# Patient Record
Sex: Female | Born: 1980 | Race: White | Hispanic: No | Marital: Single | Smoking: Current every day smoker
Health system: Southern US, Community
[De-identification: ages and names within clinical notes are randomized; demographics above are authoritative.]

## PROBLEM LIST (undated history)

## (undated) DIAGNOSIS — F17200 Nicotine dependence, unspecified, uncomplicated: Secondary | ICD-10-CM

## (undated) DIAGNOSIS — O903 Peripartum cardiomyopathy: Secondary | ICD-10-CM

## (undated) DIAGNOSIS — K859 Acute pancreatitis without necrosis or infection, unspecified: Secondary | ICD-10-CM

## (undated) DIAGNOSIS — E876 Hypokalemia: Secondary | ICD-10-CM

## (undated) DIAGNOSIS — I5021 Acute systolic (congestive) heart failure: Secondary | ICD-10-CM

## (undated) DIAGNOSIS — K219 Gastro-esophageal reflux disease without esophagitis: Secondary | ICD-10-CM

## (undated) DIAGNOSIS — J45909 Unspecified asthma, uncomplicated: Secondary | ICD-10-CM

## (undated) DIAGNOSIS — I10 Essential (primary) hypertension: Secondary | ICD-10-CM

## (undated) DIAGNOSIS — F32A Depression, unspecified: Secondary | ICD-10-CM

## (undated) DIAGNOSIS — M1990 Unspecified osteoarthritis, unspecified site: Secondary | ICD-10-CM

## (undated) DIAGNOSIS — Z720 Tobacco use: Secondary | ICD-10-CM

## (undated) DIAGNOSIS — R569 Unspecified convulsions: Secondary | ICD-10-CM

## (undated) DIAGNOSIS — F1010 Alcohol abuse, uncomplicated: Secondary | ICD-10-CM

## (undated) DIAGNOSIS — F419 Anxiety disorder, unspecified: Secondary | ICD-10-CM

## (undated) HISTORY — DX: Gastro-esophageal reflux disease without esophagitis: K21.9

## (undated) HISTORY — DX: Unspecified osteoarthritis, unspecified site: M19.90

## (undated) HISTORY — DX: Depression, unspecified: F32.A

## (undated) HISTORY — DX: Nicotine dependence, unspecified, uncomplicated: F17.200

## (undated) HISTORY — DX: Tobacco use: Z72.0

## (undated) HISTORY — DX: Essential (primary) hypertension: I10

## (undated) HISTORY — DX: Anxiety disorder, unspecified: F41.9

## (undated) HISTORY — DX: Acute pancreatitis without necrosis or infection, unspecified: K85.90

## (undated) HISTORY — PX: DILATION AND CURETTAGE OF UTERUS: SHX78

## (undated) HISTORY — DX: Peripartum cardiomyopathy: O90.3

## (undated) HISTORY — PX: TUBAL LIGATION: SHX77

## (undated) HISTORY — DX: Hypokalemia: E87.6

## (undated) HISTORY — DX: Acute systolic (congestive) heart failure: I50.21

---

## 2008-09-29 ENCOUNTER — Encounter: Payer: Self-pay | Admitting: Cardiology

## 2008-09-29 ENCOUNTER — Ambulatory Visit: Payer: Self-pay | Admitting: Cardiology

## 2008-09-29 ENCOUNTER — Inpatient Hospital Stay (HOSPITAL_COMMUNITY): Admission: EM | Admit: 2008-09-29 | Discharge: 2008-10-03 | Payer: Self-pay | Admitting: Cardiology

## 2008-10-03 ENCOUNTER — Encounter (INDEPENDENT_AMBULATORY_CARE_PROVIDER_SITE_OTHER): Payer: Self-pay | Admitting: Physician Assistant

## 2008-10-08 ENCOUNTER — Encounter: Payer: Self-pay | Admitting: Cardiology

## 2008-10-24 ENCOUNTER — Ambulatory Visit: Payer: Self-pay | Admitting: Cardiology

## 2008-10-24 DIAGNOSIS — Z720 Tobacco use: Secondary | ICD-10-CM | POA: Insufficient documentation

## 2008-10-24 DIAGNOSIS — O903 Peripartum cardiomyopathy: Secondary | ICD-10-CM | POA: Insufficient documentation

## 2008-10-24 DIAGNOSIS — E876 Hypokalemia: Secondary | ICD-10-CM | POA: Insufficient documentation

## 2008-10-24 DIAGNOSIS — I509 Heart failure, unspecified: Secondary | ICD-10-CM | POA: Insufficient documentation

## 2008-10-24 DIAGNOSIS — F17200 Nicotine dependence, unspecified, uncomplicated: Secondary | ICD-10-CM

## 2008-11-14 ENCOUNTER — Encounter: Payer: Self-pay | Admitting: Cardiology

## 2008-11-16 ENCOUNTER — Telehealth (INDEPENDENT_AMBULATORY_CARE_PROVIDER_SITE_OTHER): Payer: Self-pay | Admitting: *Deleted

## 2008-11-20 ENCOUNTER — Encounter: Payer: Self-pay | Admitting: Cardiology

## 2008-11-21 ENCOUNTER — Telehealth (INDEPENDENT_AMBULATORY_CARE_PROVIDER_SITE_OTHER): Payer: Self-pay | Admitting: *Deleted

## 2008-11-22 ENCOUNTER — Telehealth (INDEPENDENT_AMBULATORY_CARE_PROVIDER_SITE_OTHER): Payer: Self-pay | Admitting: *Deleted

## 2008-11-27 ENCOUNTER — Ambulatory Visit: Payer: Self-pay | Admitting: Cardiology

## 2008-11-27 DIAGNOSIS — E663 Overweight: Secondary | ICD-10-CM | POA: Insufficient documentation

## 2008-11-27 DIAGNOSIS — L659 Nonscarring hair loss, unspecified: Secondary | ICD-10-CM | POA: Insufficient documentation

## 2008-12-12 ENCOUNTER — Encounter: Payer: Self-pay | Admitting: Cardiology

## 2009-01-31 ENCOUNTER — Ambulatory Visit: Payer: Self-pay | Admitting: Cardiology

## 2009-03-28 ENCOUNTER — Ambulatory Visit: Payer: Self-pay | Admitting: Cardiology

## 2009-04-04 ENCOUNTER — Telehealth: Payer: Self-pay | Admitting: Cardiology

## 2009-05-01 ENCOUNTER — Ambulatory Visit: Payer: Self-pay | Admitting: Cardiology

## 2009-05-15 ENCOUNTER — Encounter: Payer: Self-pay | Admitting: Cardiology

## 2009-07-02 ENCOUNTER — Ambulatory Visit: Payer: Self-pay | Admitting: Cardiology

## 2009-07-06 ENCOUNTER — Encounter (INDEPENDENT_AMBULATORY_CARE_PROVIDER_SITE_OTHER): Payer: Self-pay | Admitting: *Deleted

## 2009-07-11 ENCOUNTER — Ambulatory Visit: Payer: Self-pay | Admitting: Cardiology

## 2009-07-11 ENCOUNTER — Encounter: Payer: Self-pay | Admitting: Cardiology

## 2010-02-12 NOTE — Assessment & Plan Note (Signed)
Summary: f/u LA   Visit Type:  Follow-up Primary Provider:  Health Dept/Dr. Ernestina Penna  CC:  Cardiomyopathy.  History of Present Illness: The patient presents for evaluation of cardiomyopathy. Since I last saw her she was in the emergency room with chest pain. She was scared by a snake. She was treated with anxiolytics and has had no symptoms since that time. She has lost weight. She is denying any new shortness of breath, PND or orthopnea. She is having no palpitations, presyncope or syncope. She is complaining of hair loss.  Preventive Screening-Counseling & Management  Alcohol-Tobacco     Smoking Status: quit     Year Quit: 2010  Current Medications (verified): 1)  Digoxin 0.125 Mg Tabs (Digoxin) .... Take 1 Tablet By Mouth Once A Day 2)  Lisinopril 10 Mg Tabs (Lisinopril) .... Take 1 Tablet By Mouth Two Times A Day 3)  Furosemide 20 Mg Tabs (Furosemide) .... Take 1 Tablet By Mouth Once A Day 4)  Carvedilol 25 Mg Tabs (Carvedilol) .... Take 1 Tablet By Mouth Two Times A Day 5)  Potassium Chloride Crys Cr 20 Meq Cr-Tabs (Potassium Chloride Crys Cr) .... Two Tabs Once Daily 6)  Lorazepam 0.5 Mg Tabs (Lorazepam) .... Take 1 Tablet Every 8 Hours. 7)  Lexapro 10 Mg Tabs (Escitalopram Oxalate) .... Take 1 Tablet By Mouth Once A Day 8)  Oxycodone-Acetaminophen 5-325 Mg Tabs (Oxycodone-Acetaminophen) .... As Needed Back Pain  Allergies (verified): No Known Drug Allergies  Comments:  Nurse/Medical Assistant: The patient is currently on medications but does not know the name or dosage at this time. Instructed to contact our office with details. Will update medication list at that time.  Past History:  Past Medical History: Reviewed history from 05/01/2009 and no changes required. 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 Tobacco use.     Past Surgical History: Reviewed history from 11/27/2008 and no changes  required. Tubal ligation D&C  Review of Systems       As stated in the HPI and negative for all other systems.   Vital Signs:  Patient profile:   30 year old female Height:      68 inches Weight:      187 pounds Pulse rate:   85 / minute BP sitting:   113 / 77  (left arm) Cuff size:   large  Vitals Entered By: Carlye Grippe (July 02, 2009 8:48 AM)  Physical Exam  General:  Well developed, well nourished, in no acute distress. Head:  normocephalic and atraumatic Neck:  Neck supple, no JVD. No masses, thyromegaly or abnormal cervical nodes. Chest Wall:  no deformities or breast masses noted Lungs:  Clear bilaterally to auscultation and percussion. Heart:  Non-displaced PMI, chest non-tender; regular rate and rhythm, S1, S2 without murmurs, rubs or gallops. Carotid upstroke normal, no bruit. Normal abdominal aortic size, no bruits. Femorals normal pulses, no bruits. Pedals normal pulses. No edema, no varicosities. Abdomen:  Bowel sounds positive; abdomen soft and non-tender without masses, organomegaly, or hernias noted. No hepatosplenomegaly. Msk:  Back normal, normal gait. Muscle strength and tone normal. Extremities:  No clubbing or cyanosis. Neurologic:  Alert and oriented x 3. Psych:  Normal affect.   Impression & Recommendations:  Problem # 1:  CHF (ICD-428.0) Today I will not titrate her meds further. Her blood pressure would not allow. I will repeat an echocardiogram. We again discussed salt and fluid restriction and the need to continue this  medications.  Problem # 2:  ALOPECIA (ZOX-096.04) This certainly could be related to beta blockers. However, after reviewing the importance of these drugs she chooses to continue. I suggested she also consult with her GYN to see if he thinks it could be an alternative reason. We also discussed care of her hair and I asked her to limit the trauma to her hair and discuss this with her hairdresser.  Other Orders: 2-D Echocardiogram  (2D Echo)  Patient Instructions: 1)  Your physician has requested that you have an echocardiogram.  Echocardiography is a painless test that uses sound waves to create images of your heart. It provides your doctor with information about the size and shape of your heart and how well your heart's chambers and valves are working.  This procedure takes approximately one hour. There are no restrictions for this procedure. 2)  Your physician recommends that you continue on your current medications as directed. Please refer to the Current Medication list given to you today. 3)  Your physician wants you to follow-up in: 4 months. You will receive a reminder letter in the mail one-two months in advance. If you don't receive a letter, please call our office to schedule the follow-up appointment.

## 2010-02-12 NOTE — Assessment & Plan Note (Signed)
Summary: f/u LA   Visit Type:  Follow-up Primary Provider:  Health Dept/Dr. Ernestina Penna  CC:  CHF.  History of Present Illness: The patient presents for followup of her cardiomyopathy. Since I last saw her she has done relatively well. She tolerated an increased dose of carvedilol to 12.5 mg b.i.d. She has had no new presyncope or syncope with this. Should get dyspneic occasionally but can walk 15 minutes to the store and 15 minutes back without significant dyspnea. She doesn't describe PND or orthopnea. She only takes Lasix p.r.n. and doesn't need this frequently. She denies any chest pressure, neck or arm discomfort.  Preventive Screening-Counseling & Management  Alcohol-Tobacco     Smoking Status: quit > 6 months     Year Started: 1998     Year Quit: 2010  Current Medications (verified): 1)  Digoxin 0.125 Mg Tabs (Digoxin) .... Take 1 Tablet By Mouth Once A Day 2)  Lisinopril 10 Mg Tabs (Lisinopril) .... Take 1 Tablet By Mouth Two Times A Day 3)  Furosemide 20 Mg Tabs (Furosemide) .... Take 1 Tablet By Mouth Once A Day 4)  Carvedilol 12.5 Mg Tabs (Carvedilol) .... Take 1&1/2 Tablet By Mouth Two Times A Day 5)  Potassium Chloride Crys Cr 20 Meq Cr-Tabs (Potassium Chloride Crys Cr) .... Two Tabs Once Daily 6)  Lorazepam 0.5 Mg Tabs (Lorazepam) .... Take 1 Tablet Every 8 Hours. 7)  Lexapro 10 Mg Tabs (Escitalopram Oxalate) .... Take 1 Tablet By Mouth Once A Day 8)  Oxycodone-Acetaminophen 5-325 Mg Tabs (Oxycodone-Acetaminophen) .... As Needed Back Pain  Allergies (verified): No Known Drug Allergies  Comments:  Nurse/Medical Assistant: The patient's medications were reviewed with the patient and were updated in the Medication List. Pt verbally confirmed medications.  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 Tobacco use.      Review of Systems       As stated in the HPI and negative for all other systems.   Vital Signs:  Patient profile:   30 year old female Height:      68 inches Weight:      192.50 pounds BMI:     29.38 Pulse rate:   73 / minute BP sitting:   123 / 88  (left arm) Cuff size:   regular  Vitals Entered By: Cyril Loosen, RN, BSN (March 28, 2009 2:04 PM)  Nutrition Counseling: Patient's BMI is greater than 25 and therefore counseled on weight management options. CC: CHF Comments No cardiac complaints, except fatigue with extertion like cleaning.   Physical Exam  General:  Well developed, well nourished, in no acute distress. Head:  normocephalic and atraumatic Neck:  Neck supple, no JVD. No masses, thyromegaly or abnormal cervical nodes. Lungs:  Clear bilaterally to auscultation and percussion. Abdomen:  Bowel sounds positive; abdomen soft and non-tender without masses, organomegaly, or hernias noted. No hepatosplenomegaly,  Msk:  Back normal, normal gait. Muscle strength and tone normal. Extremities:  No clubbing or cyanosis. Neurologic:  Alert and oriented x 3. Skin:  Intact without lesions or rashes. Psych:  Normal affect.   Detailed Cardiovascular Exam  Neck    Carotids: Carotids full and equal bilaterally without bruits.      Neck Veins: Normal, no JVD.    Heart    Inspection: no deformities or lifts noted.      Palpation: normal PMI  with no thrills palpable.      Auscultation: regular rate and rhythm, S1, S2 without murmurs, rubs, gallops, or clicks.    Vascular    Abdominal Aorta: no palpable masses, pulsations, or audible bruits.      Femoral Pulses: normal femoral pulses bilaterally.      Pedal Pulses: normal pedal pulses bilaterally.      Radial Pulses: normal radial pulses bilaterally.      Peripheral Circulation: no clubbing, cyanosis, or edema noted with normal capillary refill.     Impression & Recommendations:  Problem # 1:  PERIPARTUM  CARDIOMYOPATHY ANTPRTM COND/COMP (BJY-782.95) Today I will titrate her carvedilol to 18.75 mg b.i.d. The plan will be 25 b.i.d. She is at target 86. In the future I will reevaluate her with followup echocardiography.  Problem # 2:  OVERWEIGHT (ICD-278.02) I gave her some simple diet and and exercise instructions.  Problem # 3:  TOBACCO ABUSE (ICD-305.1) She reports that she is no longer smoking.  Patient Instructions: 1)  Your physician has recommended you make the following change in your medication:INCREASE CARVEDILOL 12.5MG  TO 1&1/2 TWICE DAILY. Your new medication dose has been sent to your pharmacy. 2)  Your physician recommends that you schedule a follow-up appointment in: 05/01/09 @10 :45am with Dr.Alyzah Pelly.  Prescriptions: CARVEDILOL 12.5 MG TABS (CARVEDILOL) Take 1&1/2 tablet by mouth two times a day  #90 x 6   Entered by:   Carlye Grippe   Authorized by:   Rollene Rotunda, MD, [facility]   Signed by:   Carlye Grippe on 03/28/2009   Method used:   Electronically to        [facility] [address] (retail)       [address]       [place], [place]  [address]       Ph: [phone] or [phone]       [place]: [phone]   RxID:   [id number]

## 2010-02-12 NOTE — Assessment & Plan Note (Signed)
Summary: 6 WK FU PER DR. Siyana Erney @ CHECK OUT   Visit Type:  Follow-up Primary Provider:  Health Dept/Dr. Ernestina Penna  CC:  Peripartum Cardiomyopathy.  History of Present Illness: The patient presents for followup of this. She insisted she had an echocardiogram in the hospital in December. She says this would have been done at [facility]. However, we have searched everywhere and called her primary physician and find no evidence of this. Since I last saw her she has had no acute cardiac complaints. She denies any new shortness of breath, PND or orthopnea. She does occasionally get short of breath with activities such as carrying a laundry or her children. She has slowly gained weight but has had no swelling. She has had no chest pressure, neck or arm discomfort. She has had no palpitations, presyncope or syncope. She takes her Lasix only if she has swelling.  Preventive Screening-Counseling & Management  Alcohol-Tobacco     Smoking Status: quit > 6 months  Comments: Pt quit about 1 year ago after smoking for about 13 yrs.  Current Medications (verified): 1)  Digoxin 0.125 Mg Tabs (Digoxin) .... Take 1 Tablet By Mouth Once A Day 2)  Lisinopril 10 Mg Tabs (Lisinopril) .... Take 1 Tablet By Mouth Two Times A Day 3)  Furosemide 20 Mg Tabs (Furosemide) .... Take 1 Tablet By Mouth Once A Day 4)  Carvedilol 12.5 Mg Tabs (Carvedilol) .... Take 1 Tablet By Mouth Two Times A Day 5)  Potassium Chloride Crys Cr 20 Meq Cr-Tabs (Potassium Chloride Crys Cr) .... Two Tabs Once Daily 6)  Lorazepam 0.5 Mg Tabs (Lorazepam) .... Take 1 Tablet Every 8 Hours. 7)  Lexapro 10 Mg Tabs (Escitalopram Oxalate) .... Take 1 Tablet By Mouth Once A Day 8)  Oxycodone-Acetaminophen 5-325 Mg Tabs (Oxycodone-Acetaminophen) .... As Needed Back Pain  Allergies (verified): No Known Drug Allergies  Comments:  Nurse/Medical Assistant: The patient's medications were reviewed with the patient and were updated in the Medication  List. Pt brought a list of medications to office visit today. 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 History of tobacco use.     Review of Systems       Fatigue.  Otherwise has had other systems.  Vital Signs:  Patient profile:   30 year old female Height:      68 inches Weight:      185.50 pounds Pulse rate:   80 / minute BP sitting:   101 / 68  (left arm) Cuff size:   regular  Vitals Entered By: Cyril Loosen, RN, BSN (January 31, 2009 3:01 PM) CC: Peripartum Cardiomyopathy   Physical Exam  General:  Well developed, well nourished, in no acute distress. Head:  normocephalic and atraumatic Eyes:  PERRLA/EOM intact; conjunctiva and lids normal. Mouth:  Teeth, gums and palate normal. Oral mucosa normal. Neck:  Neck supple, no JVD. No masses, thyromegaly or abnormal cervical nodes. Chest Wall:  no deformities or breast masses noted Lungs:  Clear bilaterally to auscultation and percussion. Abdomen:  Bowel sounds positive; abdomen soft and non-tender without masses, organomegaly, or hernias noted. No hepatosplenomegaly,  Msk:  Back normal, normal gait. Muscle strength and tone normal. Extremities:  No clubbing or cyanosis. Neurologic:  Alert and oriented x 3. Skin:  Intact without lesions or rashes. Cervical Nodes:  no significant adenopathy Axillary Nodes:  no significant adenopathy  Inguinal Nodes:  no significant adenopathy Psych:  Normal affect.   Detailed Cardiovascular Exam  Neck    Carotids: Carotids full and equal bilaterally without bruits.      Neck Veins: Normal, no JVD.    Heart    Inspection: no deformities or lifts noted.      Palpation: normal PMI with no thrills palpable.      Auscultation: regular rate and rhythm, S1, S2 without murmurs, rubs, gallops, or clicks.    Vascular    Abdominal Aorta: no  palpable masses, pulsations, or audible bruits.      Femoral Pulses: normal femoral pulses bilaterally.      Pedal Pulses: normal pedal pulses bilaterally.      Radial Pulses: normal radial pulses bilaterally.      Peripheral Circulation: no clubbing, cyanosis, or edema noted with normal capillary refill.     Impression & Recommendations:  Problem # 1:  PERIPARTUM CARDIOMYOPATHY ANTPRTM COND/COMP (WJX-914.78) The patient has no symptoms I will try to titrate up to 12.5 mg is good she does complain of fatigue and does have a relatively low blood pressure but I think she will tolerate this she'll let me know if she has any increasing fatigue with episodes of lightheadedness or other.  I will see herher back in several weeks.  I will plan an echo in several weeks.  Problem # 2:  TOBACCO ABUSE (ICD-305.1) She reports that she is not smoking cigarettes.  Problem # 3:  OVERWEIGHT (ICD-278.02) She understands the importance of weight loss with diet and exercise.  Patient Instructions: 1)  Your physician recommends that you continue on your current medications as directed. Please refer to the Current Medication list given to you today.INCREASE CARVEDILOL TO 12.5MG  TWICE DAILY. 2)  Your physician recommends that you schedule a follow-up appointment in: 03/28/09 @2 :00pm with Dr. Antoine Poche. Prescriptions: CARVEDILOL 12.5 MG TABS (CARVEDILOL) Take 1 tablet by mouth two times a day  #60 x 6   Entered by:   Carlye Grippe   Authorized by:   Rollene Rotunda, MD, [facility]   Signed by:   Carlye Grippe on 01/31/2009   Method used:   Electronically to        [facility] [address] (retail)       [address]       [place], [place]  [address]       Ph: [phone] or [phone]       [place]: [phone]   RxID:   [id number]

## 2010-02-12 NOTE — Assessment & Plan Note (Signed)
Summary: 99month f/u LA   Visit Type:  Follow-up Primary Provider:  Health Dept/Dr. Ernestina Penna  CC:  Cardiomyopathy.  History of Present Illness: The patient presents for followup of her nonischemic cardiomyopathy. At the last visit I increased her carvedilol to 18.75 mg b.i.d. She did well with this. She has had no new shortness of breath. She denies any PND or orthopnea. She has been slightly lightheaded and does get some orthostatic symptoms. She's had no new palpitations or chest pain. She is walking for exercise but still gets fatigued.  Preventive Screening-Counseling & Management  Alcohol-Tobacco     Smoking Status: quit     Year Started: 1998     Year Quit: 2010  Current Medications (verified): 1)  Digoxin 0.125 Mg Tabs (Digoxin) .... Take 1 Tablet By Mouth Once A Day 2)  Lisinopril 10 Mg Tabs (Lisinopril) .... Take 1 Tablet By Mouth Two Times A Day 3)  Furosemide 20 Mg Tabs (Furosemide) .... Take 1 Tablet By Mouth Once A Day 4)  Carvedilol 25 Mg Tabs (Carvedilol) .... Take 1 Tablet By Mouth Two Times A Day 5)  Potassium Chloride Crys Cr 20 Meq Cr-Tabs (Potassium Chloride Crys Cr) .... Two Tabs Once Daily 6)  Lorazepam 0.5 Mg Tabs (Lorazepam) .... Take 1 Tablet Every 8 Hours. 7)  Lexapro 10 Mg Tabs (Escitalopram Oxalate) .... Take 1 Tablet By Mouth Once A Day 8)  Oxycodone-Acetaminophen 5-325 Mg Tabs (Oxycodone-Acetaminophen) .... As Needed Back Pain  Allergies (verified): No Known Drug Allergies  Comments:  Nurse/Medical Assistant: The patient's medications were reviewed with the patient and were updated in the Medication List. Pt brought a list of medications to office visit.  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 Tobacco use.     Past Surgical History: Reviewed history from 11/27/2008 and no changes  required. Tubal ligation D&C  Social History: Smoking Status:  quit  Review of Systems       As stated in the HPI and negative for all other systems.   Vital Signs:  Patient profile:   30 year old female Height:      68 inches Weight:      191.25 pounds Pulse rate:   83 / minute BP sitting:   110 / 76  (left arm) Cuff size:   regular  Vitals Entered By: Cyril Loosen, RN, BSN (May 01, 2009 10:44 AM) CC: Cardiomyopathy Comments Follow up visit.   Physical Exam  General:  Well developed, well nourished, in no acute distress. Head:  normocephalic and atraumatic Eyes:  PERRLA/EOM intact; conjunctiva and lids normal. Mouth:  Teeth, gums and palate normal. Oral mucosa normal. Neck:  Neck supple, no JVD. No masses, thyromegaly or abnormal cervical nodes. Lungs:  Clear bilaterally to auscultation and percussion. Abdomen:  Bowel sounds positive; abdomen soft and non-tender without masses, organomegaly, or hernias noted. No hepatosplenomegaly,  Msk:  Back normal, normal gait. Muscle strength and tone normal. Extremities:  No clubbing or cyanosis. Neurologic:  Alert and oriented x 3. Skin:  Intact without lesions or rashes. Psych:  Normal affect.   Detailed Cardiovascular Exam  Neck    Carotids: Carotids full and equal bilaterally without bruits.      Neck Veins: Normal, no JVD.    Heart    Inspection: no deformities or lifts noted.      Palpation: normal PMI with  no thrills palpable.      Auscultation: regular rate and rhythm, S1, S2 without murmurs, rubs, gallops, or clicks.    Vascular    Abdominal Aorta: no palpable masses, pulsations, or audible bruits.      Femoral Pulses: normal femoral pulses bilaterally.      Pedal Pulses: normal pedal pulses bilaterally.      Radial Pulses: normal radial pulses bilaterally.      Peripheral Circulation: no clubbing, cyanosis, or edema noted with normal capillary refill.     Impression & Recommendations:  Problem # 1:   CHF (ICD-428.0) Today I will try to increase her carvedilol to 25 mg twice a day. I would like for the lisinopril to eventually be 20 though her blood pressure may not allow.  Problem # 2:  OVERWEIGHT (ICD-278.02) We discussed the need to lose weight with diet and exercise.  Patient Instructions: 1)  Your physician recommends that you schedule a follow-up appointment in: JUNE 20TH 2011 @9 :00AM WITH DR. Levi Crass. 2)  Your physician has recommended you make the following change in your medication: INCREASE YOUR CARVEDILOL 25MG  TWICE DAILY. You may take 2 of your 12.5mg  twice daily until they are finished then get your new prescription that has been sent to your pharmacy.  Prescriptions: CARVEDILOL 25 MG TABS (CARVEDILOL) Take 1 tablet by mouth two times a day  #60 x 3   Entered by:   Carlye Grippe   Authorized by:   Rollene Rotunda, MD, [facility]   Signed by:   Carlye Grippe on 05/01/2009   Method used:   Electronically to        [facility] [address] (retail)       [address]       [place], [place]  [address]       Ph: [phone] or [phone]       [place]: [phone]   RxID:   [id number]

## 2010-02-12 NOTE — Progress Notes (Signed)
Summary: REQUEST LETTER R/E WORK STATUS  Phone Note Call from Patient Call back at [phone]   Caller: Patient Call For: nurse Summary of Call: message left on machine to please call. nurse called patient back and she said that she  needed a statement from MD for social services saying that she has heart problems and that she's unable to work at this time so that she can receive income for her children. Nurse informed patient request would be sent to MD.  Initial call taken by: Carlye Grippe,  April 04, 2009 11:20 AM

## 2010-02-12 NOTE — Letter (Signed)
Summary: Generic Engineer, agricultural at [facility] [address] [address]   [place], [place] [address]   Phone: [phone]  [place]: [phone]    07/06/2009  JENEVA SCHWEIZER [address] [place], [place]  [address]  Dear Ms. SEDANO,   Zigmund Gottron is a copy of your order for the 2 D Echo ordered by Dr. Antoine Poche. If for some reason you can not keep this appointment please call our office and we will be happy to re-schedule.        Sincerely,   Zachary George Patient Care Coordinator [phone] ext [phone]

## 2010-04-19 LAB — URINALYSIS, ROUTINE W REFLEX MICROSCOPIC
Leukocytes, UA: NEGATIVE
Nitrite: NEGATIVE
Specific Gravity, Urine: 1.015 (ref 1.005–1.030)
Urobilinogen, UA: 1 mg/dL (ref 0.0–1.0)
pH: 7.5 (ref 5.0–8.0)

## 2010-04-19 LAB — CBC
Hemoglobin: 9.6 g/dL — ABNORMAL LOW (ref 12.0–15.0)
MCHC: 33.8 g/dL (ref 30.0–36.0)
MCV: 88.7 fL (ref 78.0–100.0)
RBC: 3.2 MIL/uL — ABNORMAL LOW (ref 3.87–5.11)
RBC: 3.7 MIL/uL — ABNORMAL LOW (ref 3.87–5.11)
WBC: 7.1 10*3/uL (ref 4.0–10.5)
WBC: 8.9 10*3/uL (ref 4.0–10.5)

## 2010-04-19 LAB — BASIC METABOLIC PANEL
BUN: 1 mg/dL — ABNORMAL LOW (ref 6–23)
BUN: 4 mg/dL — ABNORMAL LOW (ref 6–23)
BUN: 7 mg/dL (ref 6–23)
CO2: 26 mEq/L (ref 19–32)
CO2: 27 mEq/L (ref 19–32)
Calcium: 8.8 mg/dL (ref 8.4–10.5)
Calcium: 9.2 mg/dL (ref 8.4–10.5)
Chloride: 107 mEq/L (ref 96–112)
Creatinine, Ser: 0.77 mg/dL (ref 0.4–1.2)
Creatinine, Ser: 0.88 mg/dL (ref 0.4–1.2)
Creatinine, Ser: 0.94 mg/dL (ref 0.4–1.2)
GFR calc Af Amer: >60 mL/min (ref >60)
GFR calc Af Amer: >60 mL/min (ref >60)
GFR calc Af Amer: >60 mL/min (ref >60)
GFR calc non Af Amer: >60 mL/min (ref >60)
GFR calc non Af Amer: >60 mL/min (ref >60)
GFR calc non Af Amer: >60 mL/min (ref >60)
Glucose, Bld: 84 mg/dL (ref 70–99)
Glucose, Bld: 87 mg/dL (ref 70–99)
Potassium: 3.2 mEq/L — ABNORMAL LOW (ref 3.5–5.1)
Potassium: 3.3 mEq/L — ABNORMAL LOW (ref 3.5–5.1)
Potassium: 3.6 mEq/L (ref 3.5–5.1)
Sodium: 141 mEq/L (ref 135–145)
Sodium: 141 mEq/L (ref 135–145)
Sodium: 143 mEq/L (ref 135–145)
Sodium: 144 mEq/L (ref 135–145)

## 2010-04-19 LAB — URINE MICROSCOPIC-ADD ON

## 2010-04-19 LAB — CARDIAC PANEL(CRET KIN+CKTOT+MB+TROPI)
CK, MB: 2.6 ng/mL (ref 0.3–4.0)
CK, MB: 4.5 ng/mL — ABNORMAL HIGH (ref 0.3–4.0)
Relative Index: 1.7 (ref 0.0–2.5)
Relative Index: 2 (ref 0.0–2.5)
Relative Index: 2 (ref 0.0–2.5)
Total CK: 127 U/L (ref 7–177)
Troponin I: 0.1 ng/mL — ABNORMAL HIGH (ref 0.00–0.06)

## 2010-04-19 LAB — MAGNESIUM: Magnesium: 2 mg/dL (ref 1.5–2.5)

## 2010-07-18 ENCOUNTER — Encounter: Payer: Self-pay | Admitting: Cardiology

## 2015-11-28 ENCOUNTER — Emergency Department (HOSPITAL_COMMUNITY): Payer: Self-pay

## 2015-11-28 ENCOUNTER — Emergency Department (HOSPITAL_COMMUNITY)
Admission: EM | Admit: 2015-11-28 | Discharge: 2015-11-28 | Disposition: A | Discharge status: Home / Self Care | Payer: Self-pay | Attending: Emergency Medicine | Admitting: Emergency Medicine

## 2015-11-28 ENCOUNTER — Encounter (HOSPITAL_COMMUNITY): Payer: Self-pay

## 2015-11-28 DIAGNOSIS — R079 Chest pain, unspecified: Secondary | ICD-10-CM

## 2015-11-28 DIAGNOSIS — F17200 Nicotine dependence, unspecified, uncomplicated: Secondary | ICD-10-CM | POA: Insufficient documentation

## 2015-11-28 DIAGNOSIS — R0602 Shortness of breath: Secondary | ICD-10-CM | POA: Insufficient documentation

## 2015-11-28 DIAGNOSIS — O9270 Unspecified disorders of lactation: Secondary | ICD-10-CM

## 2015-11-28 DIAGNOSIS — M7989 Other specified soft tissue disorders: Secondary | ICD-10-CM | POA: Insufficient documentation

## 2015-11-28 DIAGNOSIS — Z79899 Other long term (current) drug therapy: Secondary | ICD-10-CM | POA: Insufficient documentation

## 2015-11-28 DIAGNOSIS — I509 Heart failure, unspecified: Secondary | ICD-10-CM | POA: Insufficient documentation

## 2015-11-28 DIAGNOSIS — O9279 Other disorders of lactation: Secondary | ICD-10-CM | POA: Insufficient documentation

## 2015-11-28 LAB — BASIC METABOLIC PANEL
Anion gap: 6 (ref 5–15)
CALCIUM: 9 mg/dL (ref 8.9–10.3)
CHLORIDE: 105 mmol/L (ref 101–111)
CO2: 27 mmol/L (ref 22–32)
CREATININE: 0.8 mg/dL (ref 0.44–1.00)
GFR calc non Af Amer: >60 mL/min (ref >60)
GLUCOSE: 80 mg/dL (ref 65–99)
Potassium: 3.2 mmol/L — ABNORMAL LOW (ref 3.5–5.1)
Sodium: 138 mmol/L (ref 135–145)

## 2015-11-28 LAB — CBC
HCT: 37.8 % (ref 36.0–46.0)
Hemoglobin: 14 g/dL (ref 12.0–15.0)
MCH: 34.5 pg — AB (ref 26.0–34.0)
MCHC: 37 g/dL — AB (ref 30.0–36.0)
MCV: 93.1 fL (ref 78.0–100.0)
PLATELETS: 198 10*3/uL (ref 150–400)
RBC: 4.06 MIL/uL (ref 3.87–5.11)
RDW: 14.3 % (ref 11.5–15.5)
WBC: 8.8 10*3/uL (ref 4.0–10.5)

## 2015-11-28 LAB — TROPONIN I

## 2015-11-28 MED ORDER — NAPROXEN 500 MG PO TABS: 500.0000 mg | ORAL_TABLET | Freq: Two times a day (BID) | ORAL | 1 refills | Status: DC

## 2015-11-28 NOTE — ED Triage Notes (Signed)
Per Eustace QuailMegan Mitchell, RN pt has strong palpable pedal pulse in left foot.

## 2015-11-28 NOTE — ED Provider Notes (Signed)
[facility]Y DEPT Provider Note   CSN: [id number] Arrival date & time: 11/28/15  1826  By signing my name below, I, Melissa Barrett, attest that this documentation has been prepared under the direction and in the presence of Vanetta MuldersScott Ekaterina Denise, MD . Electronically Signed: Christy SartoriusAnastasia Barrett, Scribe. 11/28/2015. 10:59 PM.  History   Chief Complaint Chief Complaint  Patient presents with  . Leg Swelling  The history is provided by the patient and medical records. No language interpreter was used.     HPI Comments:  Melissa Barrett is a 35 y.o. female who presents to the Emergency Department complaining of left leg swelling x 3 days.  She states it began with chest pain 3 days ago.  Later that day her foot started swelling and became numb.  She also notes a pruritic rash on her right leg.  Pt was seen in [place] 2 days ago and had a doppler study of the swollen leg done at 8:30 AM yesterday that showed no clots.  She tried tylenol without relief.  Pt was given an initial dose of a blood thinner, but was discontinued.  Pt is seen at the health department.  Pt is trying tylenol without relief.   Pt also complains of her breasts lactating.  She [facility] which was negative.   Past Medical History:  Diagnosis Date  . Hypokalemia   . Peripartum cardiomyopathy    with acute systolic CHF  . Tobacco abuse     Patient Active Problem List   Diagnosis Date Noted  . OVERWEIGHT 11/27/2008  . ALOPECIA 11/27/2008  . HYPOKALEMIA 10/24/2008  . TOBACCO ABUSE 10/24/2008  . CHF 10/24/2008  . PERIPARTUM CARDIOMYOPATHY ANTPRTM COND/COMP 10/24/2008    Past Surgical History:  Procedure Laterality Date  . DILATION AND CURETTAGE OF UTERUS    . TUBAL LIGATION      OB History    No data available       Home Medications    Prior to Admission medications   Medication Sig Start Date End Date Taking? Authorizing Provider  carvedilol (COREG) 25 MG tablet Take 25 mg  by mouth 2 (two) times daily with a meal.      Historical Provider, MD  digoxin (LANOXIN) 0.125 MG tablet Take 125 mcg by mouth daily.      Historical Provider, MD  escitalopram (LEXAPRO) 10 MG tablet Take 10 mg by mouth daily.      Historical Provider, MD  furosemide (LASIX) 20 MG tablet Take 20 mg by mouth daily.      Historical Provider, MD  lisinopril (PRINIVIL,ZESTRIL) 10 MG tablet Take 10 mg by mouth daily.      Historical Provider, MD  LORazepam (ATIVAN) 0.5 MG tablet Take 0.5 mg by mouth every 8 (eight) hours.      Historical Provider, MD  naproxen (NAPROSYN) 500 MG tablet Take 1 tablet (500 mg total) by mouth 2 (two) times daily. 11/28/15   Vanetta MuldersScott [place] Mallon, MD  oxyCODONE-acetaminophen (PERCOCET) 5-325 MG per tablet Take 1 tablet by mouth every 4 (four) hours as needed.      Historical Provider, MD  potassium chloride SA (K-DUR,KLOR-CON) 20 MEQ tablet Take 20 mEq by mouth 2 (two) times daily.      Historical Provider, MD    Family History No family history on file.  Social History Social History  Substance Use Topics  . Smoking status: Current Every Day Smoker  . Smokeless tobacco: Not on file  . Alcohol use  Yes     Comment: occ     Allergies   Amoxicillin and Penicillins   Review of Systems Review of Systems  Constitutional: Negative for chills and fever.  HENT: Negative for rhinorrhea and sore throat.   Eyes: Negative for visual disturbance.  Respiratory: Positive for cough and shortness of breath.   Cardiovascular: Positive for chest pain. Negative for leg swelling.  Gastrointestinal: Negative for abdominal pain, nausea and vomiting.  Genitourinary: Negative for dysuria and hematuria.  Musculoskeletal: Negative for back pain.  Skin: Negative for rash.  Neurological: Positive for headaches.  Hematological: Bruises/bleeds easily.  Psychiatric/Behavioral: Negative for confusion.     Physical Exam Updated Vital Signs BP 124/89 (BP Location: Right Arm)   Pulse 73    Temp 98.4 F (36.9 C) (Oral)   Resp 16   Ht 5\' 5"  (1.651 m)   Wt 72.6 kg   LMP 11/14/2015   SpO2 99%   BMI 26.63 kg/m   Physical Exam  Constitutional: She is oriented to person, place, and time. She appears well-developed and well-nourished. No distress.  HENT:  Head: Normocephalic and atraumatic.  Eyes: Conjunctivae and EOM are normal. Pupils are equal, round, and reactive to light. No scleral icterus.  Cardiovascular: Normal rate and regular rhythm.   Pulmonary/Chest: Effort normal and breath sounds normal. She has no wheezes. She has no rales.  Abdominal: Soft. Bowel sounds are normal. There is no tenderness.  Musculoskeletal:  No pitting edema bilaterally.  No swelling in the knee or over the ankles.    Neurological: She is alert and oriented to person, place, and time. No cranial nerve deficit or sensory deficit. She exhibits normal muscle tone. Coordination normal.  Skin: Skin is warm and dry. Rash noted.  On the edial aspect of her right leg she has 5 red macules.  No vessicles.  No surrounding erythema.  Increased warmth over the area.    Psychiatric: She has a normal mood and affect.  Nursing note and vitals reviewed.    ED Treatments / Results   DIAGNOSTIC STUDIES:  Oxygen Saturation is 100% on RA, NML by my interpretation.    COORDINATION OF CARE:  10:58 PM Discussed treatment plan with pt at bedside and pt agreed to plan.  Labs (all labs ordered are listed, but only abnormal results are displayed) Labs Reviewed  BASIC METABOLIC PANEL - Abnormal; Notable for the following:       Result Value   Potassium 3.2 (*)    BUN <5 (*)    All other components within normal limits  CBC - Abnormal; Notable for the following:    MCH 34.5 (*)    MCHC 37.0 (*)    All other components within normal limits  TROPONIN I    EKG  EKG Interpretation  Date/Time:  Wednesday November 28 2015 18:48:09 EST Ventricular Rate:  84 PR Interval:  140 QRS Duration: 76 QT  Interval:  356 QTC Calculation: 420 R Axis:   66 Text Interpretation:  Normal sinus rhythm Nonspecific T wave abnormality Abnormal ECG No previous ECGs available Confirmed by Destynee Stringfellow  MD, Marjan Rosman [id number]) on 11/28/2015 11:09:40 PM       Radiology Dg Chest 2 View  Result Date: 11/28/2015 CLINICAL DATA:  Left-sided chest pain EXAM: CHEST  2 VIEW COMPARISON:  Chest CT 11/27/2015 FINDINGS: Cardiomediastinal contours are normal. No pneumothorax or pleural effusion. No focal airspace consolidation or pulmonary edema. IMPRESSION: Clear lungs. Electronically Signed   By: Chrisandra Netters.D.  On: 11/28/2015 19:53    Procedures Procedures (including critical care time)  Medications Ordered in ED Medications - No data to display   Initial Impression / Assessment and Plan / ED Course  I have reviewed the triage vital signs and the nursing notes.  Pertinent labs & imaging results that were available during my care of the patient were reviewed by me and considered in my medical decision making (see chart for details).  Clinical Course    Patient with 3 day history of right leg swelling chest pain and several week history of bilateral breast lactation. [facility] with [facility] Doppler negative pregnancy tests. Workup here today to include the negative chest x-ray normal EKG labs without significant abnormality to include troponin. Patient with mild hypokalemia but no severe treatment required. Patient stable for discharge home. [facility] and back to health department. Patient will be given a trial of Naprosyn for the leg pain and swelling in the chest pain.  Final Clinical Impressions(s) / ED Diagnoses   Final diagnoses:  Right leg swelling  Chest pain, unspecified type  Lactation disorder    New Prescriptions New Prescriptions   NAPROXEN (NAPROSYN) 500 MG TABLET    Take 1 tablet (500 mg total) by mouth 2 (two) times daily.   I personally performed  the services described in this documentation, which was scribed in my presence. The recorded information has been reviewed and is accurate.      Vanetta MuldersScott Jared Whorley, MD 11/28/15 [phone]

## 2015-11-28 NOTE — ED Triage Notes (Signed)
Pt reports pain and swelling in left leg x 3 days.  Reports yesterday she also had numbness on left side of body and chest pain.  Reports the numbness lasted for 1 hour.  [facility] and was told she had an elevated d dimer and was given xarelto and was told to return for doppler US of left leg.  Reports was told the US was negative.  Pt also concerned because she has been leaking breast milk from both breasts for the past 4 months.

## 2015-11-28 NOTE — ED Notes (Signed)
Ekg given to Dr. Deretha EmoryZackowski.

## 2015-11-28 NOTE — Discharge Instructions (Signed)
Today's workup without significant findings. In addition very [facility] to rule out blood clot in the leg. Take the Naprosyn as directed. For the lactation problem make an appointment and follow-up with OB/GYN referral information provided. Return for any new or worse symptoms. Make an appoint also follow-up with the health department.

## 2016-09-26 ENCOUNTER — Ambulatory Visit (INDEPENDENT_AMBULATORY_CARE_PROVIDER_SITE_OTHER): Payer: Medicaid Other | Admitting: Cardiology

## 2016-09-26 ENCOUNTER — Encounter: Payer: Self-pay | Admitting: *Deleted

## 2016-09-26 VITALS — BP 130/87 | HR 84 | Ht 66.0 in | Wt 161.8 lb

## 2016-09-26 DIAGNOSIS — R400 Somnolence: Secondary | ICD-10-CM

## 2016-09-26 DIAGNOSIS — R002 Palpitations: Secondary | ICD-10-CM | POA: Diagnosis not present

## 2016-09-26 DIAGNOSIS — Z8679 Personal history of other diseases of the circulatory system: Secondary | ICD-10-CM | POA: Diagnosis not present

## 2016-09-26 MED ORDER — METOPROLOL TARTRATE 25 MG PO TABS: 25.0000 mg | ORAL_TABLET | Freq: Two times a day (BID) | ORAL | 6 refills | Status: DC

## 2016-09-26 NOTE — Patient Instructions (Addendum)
Medication Instructions:   Begin Lopressor  twice a day.  Continue all other current medications.  Labwork: none  Testing/Procedures: none  Follow-Up: 3 months   Any Other Special Instructions Will Be Listed Below (If Applicable). You have been referred to:  Dr. Juanetta Gosling for evaluation of sleep apnea.   If you need a refill on your cardiac medications before your next appointment, please call your pharmacy.

## 2016-09-26 NOTE — Progress Notes (Signed)
Clinical Summary Melissa Barrett is a 36 y.o.female last seen by Dr Antoine Poche in 2011, this is our first visit together. See as a new patient for palpitations, referred by PA Muse  1. Palpitations - recent admission to [facility] with palpitations - per notes found to be in SVT, converted to NSR with IV dilt. Notes indicate this occurred in setting of allergic reaction to shrimp - D-dimer was elevated at the time, CT PE was negative. Mildly low TSH but normal free T4.   - occurs 1-3 times a week. Can occur at anytime. Lasts about 5-10 minutes. Feeling of heart racing, +dizzy, +SOB - symptoms about 2-3 months ago.  - this episode prlonged with increased SOB - coffee x 1 cup, no sodas, no tea, no energy drinks, beers x 4-5 every other day, no drug use - had allergy to shrimp   2. 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 - Echo [place] 06/2009 in the media section of epic shows LVEF normalized to 50-55% - history tubal ligation  3. OSA screen +snoring, no apneic episdoes, + daytime somnoelnce.  Past Medical History:  Diagnosis Date  . Acute systolic heart failure (HCC)   . Hypertension   . Hypokalemia   . Pancreatitis   . Peripartum cardiomyopathy    with acute systolic CHF  . Tobacco abuse   . Tobacco use disorder      Allergies  Allergen Reactions  . Amoxicillin   . Penicillins      Current Outpatient Prescriptions  Medication Sig Dispense Refill  . carvedilol (COREG) 25 MG tablet Take 25 mg by mouth 2 (two) times daily with a meal.      . digoxin (LANOXIN) 0.125 MG tablet Take 125 mcg by mouth daily.      Marland Kitchen escitalopram (LEXAPRO) 10 MG tablet Take 10 mg by mouth daily.      . furosemide (LASIX) 20 MG tablet Take 20 mg by mouth daily.      Marland Kitchen lisinopril (PRINIVIL,ZESTRIL) 10 MG tablet Take 10 mg by mouth daily.      Marland Kitchen LORazepam (ATIVAN) 0.5 MG  tablet Take 0.5 mg by mouth every 8 (eight) hours.      . naproxen (NAPROSYN) 500 MG tablet Take 1 tablet (500 mg total) by mouth 2 (two) times daily. 14 tablet 1  . oxyCODONE-acetaminophen (PERCOCET) 5-325 MG per tablet Take 1 tablet by mouth every 4 (four) hours as needed.      . potassium chloride SA (K-DUR,KLOR-CON) 20 MEQ tablet Take 20 mEq by mouth 2 (two) times daily.       No current facility-administered medications for this visit.      Past Surgical History:  Procedure Laterality Date  . CESAREAN SECTION    . DILATION AND CURETTAGE OF UTERUS    . TUBAL LIGATION       Allergies  Allergen Reactions  . Amoxicillin   . Penicillins       No family history on file.   Social History Melissa Barrett reports that she has been smoking Cigarettes.  She has been smoking about 0.50 packs per day. She does not have any smokeless tobacco history on file. Melissa Barrett reports that she drinks alcohol.   Review of Systems CONSTITUTIONAL: No weight loss, fever, chills, weakness or fatigue.  HEENT: Eyes: No visual loss,  blurred vision, double vision or yellow sclerae.No hearing loss, sneezing, congestion, runny nose or sore throat.  SKIN: No rash or itching.  CARDIOVASCULAR: per hpi RESPIRATORY: per hpi GASTROINTESTINAL: No anorexia, nausea, vomiting or diarrhea. No abdominal pain or blood.  GENITOURINARY: No burning on urination, no polyuria NEUROLOGICAL: No headache, dizziness, syncope, paralysis, ataxia, numbness or tingling in the extremities. No change in bowel or bladder control.  MUSCULOSKELETAL: No muscle, back pain, joint pain or stiffness.  LYMPHATICS: No enlarged nodes. No history of splenectomy.  PSYCHIATRIC: No history of depression or anxiety.  ENDOCRINOLOGIC: No reports of sweating, cold or heat intolerance. No polyuria or polydipsia.  Marland Kitchen   Physical Examination Vitals:   09/26/16 1016 09/26/16 1023  BP: 127/87 130/87  Pulse: 78 84  SpO2: 98% 98%   Vitals:   09/26/16  1016  Weight: 161 lb 12.8 oz (73.4 kg)  Height:  (1.676 m)    Gen: resting comfortably, no acute distress HEENT: no scleral icterus, pupils equal round and reactive, no palptable cervical adenopathy,  CV: RRR, no m/r/g, no jvd Resp: Clear to auscultation bilaterally GI: abdomen is soft, non-tender, non-distended, normal bowel sounds, no hepatosplenomegaly MSK: extremities are warm, no edema.  Skin: warm, no rash Neuro:  no focal deficits Psych: appropriate affect    Assessment and Plan  1. Palpitations/PSVT - we start lopressor  bid and follow symptoms   2. OSA screen/somnolence - refer to Dr Juanetta Gosling for sleep evaluation  3. History of peripartum CM - f/u echos showed normalization of LVEF - no recent CHF symptoms - continue to monitor     Antoine Poche, M.D.

## 2016-10-07 ENCOUNTER — Telehealth: Payer: Self-pay | Admitting: Cardiology

## 2016-10-07 NOTE — Telephone Encounter (Signed)
LMTCB

## 2016-10-07 NOTE — Telephone Encounter (Signed)
Having symptom of dizziness with new medication that was started.  Please call Cordelia Pen  Patient is at working

## 2016-10-08 NOTE — Telephone Encounter (Signed)
Step mother was unsure if lopressor helped with palpitations. Would speak with patient after work and give office a call tomorrow regarding this.

## 2016-10-08 NOTE — Telephone Encounter (Signed)
Step mother contacted office stating Lopressor has been making patient very dizzy and patient is unable to take. Is there anything else she can be switched to?

## 2016-10-08 NOTE — Telephone Encounter (Signed)
Has the new medication helped with her palpitations? If so then would try lowering dose to 12.5mg  bid. If not then would try stopping lopressor and starting diltiazem shorting acting  bid   Dominga Ferry MD

## 2016-10-09 NOTE — Addendum Note (Signed)
Addended by: Burman Nieves T on: 10/09/2016 08:18 AM   Modules accepted: Orders

## 2016-10-09 NOTE — Telephone Encounter (Signed)
Pt says Lopressor was helping with palpitations however still c/o dizziness and wanted to lower dose. Currently taking 25 mg bid. Will try 12.5 mg bid and will update [place] if dizziness doesn't resolve

## 2016-12-15 ENCOUNTER — Encounter: Payer: Self-pay | Admitting: Cardiology

## 2016-12-15 ENCOUNTER — Ambulatory Visit: Payer: Medicaid Other | Admitting: Cardiology

## 2016-12-15 ENCOUNTER — Other Ambulatory Visit: Payer: Self-pay

## 2016-12-15 VITALS — BP 130/84 | HR 70 | Ht 64.0 in | Wt 165.0 lb

## 2016-12-15 DIAGNOSIS — I471 Supraventricular tachycardia: Secondary | ICD-10-CM | POA: Diagnosis not present

## 2016-12-15 DIAGNOSIS — R002 Palpitations: Secondary | ICD-10-CM

## 2016-12-15 MED ORDER — DILTIAZEM HCL ER COATED BEADS 180 MG PO CP24: 180.0000 mg | ORAL_CAPSULE | Freq: Every day | ORAL | 3 refills | Status: DC

## 2016-12-15 NOTE — Progress Notes (Signed)
Clinical Summary Ms. Souffrant is a 36 y.o.female seen today for follow up of the following medical problems.   1. Palpitations - recent [facility] - per notes found to be in SVT, converted to NSR with IV dilt. Notes indicate this occurred in setting of allergic reaction to shrimp - D-dimer was elevated at the time, CT PE was negative. Mildly low TSH but normal free T4.   - occurs 1-3 times a week. Can occur at anytime. Lasts about 5-10 minutes. Feeling of heart racing, +dizzy, +SOB - symptoms about 2-3 months ago.  - this episode prlonged with increased SOB - coffee x 1 cup, no sodas, no tea, no energy drinks, beers x 4-5 every other day, no drug use - had allergy to shrimp  - started on lopressor 25mg  bid. Improved palpitations but caused dizziness, was lowered to 12.5mg  bid. Since our last visit appears she was changed to dilt 120mg  during recent ER visit - episode yesterday, heart racing and chest pain. Lasted 20 minutes. - episdoes occur infrequently.  - [facility]. Episode of heart racing    2. History of seizures - started on keppra - due to see neurology, new onset.     3. 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 - Echo [place] 06/2009 in the media section of epic shows LVEF normalized to 50-55% - history tubal ligation  4. OSA screen +snoring, no apneic episdoes, + daytime somnoelnce.    Past Medical History:  Diagnosis Date  . Acute systolic heart failure (HCC)   . Hypertension   . Hypokalemia   . Pancreatitis   . Peripartum cardiomyopathy    with acute systolic CHF  . Tobacco abuse   . Tobacco use disorder      Allergies  Allergen Reactions  . Amoxicillin   . Penicillins      Current Outpatient Medications  Medication Sig Dispense Refill  . acetaminophen (TYLENOL) 500 MG  tablet Take 500 mg by mouth every 6 (six) hours as needed.    . metoprolol tartrate (LOPRESSOR) 25 MG tablet Take 12.5 mg by mouth 2 (two) times daily.     No current facility-administered medications for this visit.      Past Surgical History:  Procedure Laterality Date  . CESAREAN SECTION    . DILATION AND CURETTAGE OF UTERUS    . TUBAL LIGATION       Allergies  Allergen Reactions  . Amoxicillin   . Penicillins       Family History  Problem Relation Age of Onset  . Hypertension Father   . Cancer Maternal Grandmother   . Cancer Paternal Grandmother      Social History Ms. Finlay reports that she has been smoking cigarettes.  She started smoking about 18 years ago. She has been smoking about 0.50 packs per day. she has never used smokeless tobacco. Ms. Whetstone reports that she drinks alcohol.   Review of Systems CONSTITUTIONAL: No weight loss, fever, chills, weakness or fatigue.  HEENT: Eyes: No visual loss, blurred vision, double vision or yellow sclerae.No hearing loss, sneezing, congestion, runny nose or sore throat.  SKIN: No rash or itching.  CARDIOVASCULAR: per hpi RESPIRATORY: No shortness of breath, cough or sputum.  GASTROINTESTINAL: No anorexia, nausea, vomiting or diarrhea. No abdominal pain or blood.  GENITOURINARY: No burning on urination, no polyuria NEUROLOGICAL: No headache, dizziness, syncope, paralysis, ataxia, numbness or tingling in the extremities. No change in bowel or bladder control.  MUSCULOSKELETAL: No muscle, back pain, joint pain or stiffness.  LYMPHATICS: No enlarged nodes. No history of splenectomy.  PSYCHIATRIC: No history of depression or anxiety.  ENDOCRINOLOGIC: No reports of sweating, cold or heat intolerance. No polyuria or polydipsia.  Marland Kitchen.   Physical Examination Vitals:   12/15/16 1559  BP: 130/84  Pulse: 70  SpO2: 98%   Vitals:   12/15/16 1559  Weight: 165 lb (74.8 kg)  Height: 5\' 4"  (1.626 m)    Gen: resting comfortably,  no acute distress HEENT: no scleral icterus, pupils equal round and reactive, no palptable cervical adenopathy,  CV: RRR, no m/r/g, no jvd Resp: Clear to auscultation bilaterally GI: abdomen is soft, non-tender, non-distended, normal bowel sounds, no hepatosplenomegaly MSK: extremities are warm, no edema.  Skin: warm, no rash Neuro:  no focal deficits Psych: appropriate affect     Assessment and Plan  1. Palpitations/PSVT - will increase dilt to 180mg  daily - [facility] to confirm the diagnosis of PSVT made there from prior admission    2. OSA screen/somnolence - refer to Dr Juanetta GoslingHawkins for sleep evaluation  3. History of peripartum CM - f/u echos showed normalization of LVEF - no recent CHF symptoms   F/u 4 months   Antoine PocheJonathan F. Drisana Schweickert, M.D.

## 2016-12-15 NOTE — Patient Instructions (Signed)
Medication Instructions:   Increase Diltiazem CD to 180mg  daily.   Continue all other medications.    Labwork: none  Testing/Procedures: none  Follow-Up: Your physician wants you to follow up in:  4 months.  You will receive a reminder letter in the mail one-two months in advance.  If you don't receive a letter, please call our office to schedule the follow up appointment   Any Other Special Instructions Will Be Listed Below (If Applicable).  If you need a refill on your cardiac medications before your next appointment, please call your pharmacy.

## 2016-12-16 ENCOUNTER — Encounter: Payer: Self-pay | Admitting: *Deleted

## 2016-12-17 ENCOUNTER — Encounter: Payer: Self-pay | Admitting: Cardiology

## 2017-04-14 ENCOUNTER — Ambulatory Visit: Payer: Medicaid Other | Admitting: Cardiology

## 2017-04-14 ENCOUNTER — Encounter: Payer: Self-pay | Admitting: Cardiology

## 2017-04-14 VITALS — BP 115/71 | HR 74 | Ht 64.0 in | Wt 179.4 lb

## 2017-04-14 DIAGNOSIS — R002 Palpitations: Secondary | ICD-10-CM | POA: Diagnosis not present

## 2017-04-14 DIAGNOSIS — I471 Supraventricular tachycardia: Secondary | ICD-10-CM | POA: Diagnosis not present

## 2017-04-14 NOTE — Patient Instructions (Signed)

## 2017-04-14 NOTE — Progress Notes (Signed)
Clinical Summary Melissa Barrett is a 37 y.o.female seen today for follow up of the following medical problems.     1. Palpitations - [facility] with [facility]ations, found to be in SVT. This apparently occurred in setting of allergic reaction to shrimp.  - converted to NSR with IV dilt. -D-dimer was elevated at the time, CT PE was negative. Mildly low TSH but normal free T4.   - started on lopressor 25mg  bid. Improved palpitations but caused dizziness, was lowered to 12.5mg  bid. Appears she was changed to dilt 120mg  during recent ER visit - currently on dilt 180mg  daily. Mild infrequent symptoms.    2. History of seizures - started on keppra - followed by neuro    3. 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 - Echo morehead 06/2009 in the media section of epic shows LVEF normalized to 50-55% -history tubal ligation      Past Medical History:  Diagnosis Date  . Acute systolic heart failure (HCC)   . Hypertension   . Hypokalemia   . Pancreatitis   . Peripartum cardiomyopathy    with acute systolic CHF  . Tobacco abuse   . Tobacco use disorder      Allergies  Allergen Reactions  . Amoxicillin   . Penicillins      Current Outpatient Medications  Medication Sig Dispense Refill  . acetaminophen (TYLENOL) 500 MG tablet Take 500 mg by mouth every 6 (six) hours as needed.    . butalbital-acetaminophen-caffeine (FIORICET, ESGIC) 50-325-40 MG tablet TAKE 1 TO 2 TABLETS BY MOUTH EVERY 6 HOURS AS NEEDED FOR HEADACHE.  0  . diltiazem (CARDIZEM CD) 180 MG 24 hr capsule Take 1 capsule (180 mg total) by mouth daily. 90 capsule 3  . levETIRAcetam (KEPPRA) 500 MG tablet Take 500 mg by mouth at bedtime.  0   No current facility-administered medications for this visit.      Past Surgical History:  Procedure Laterality Date  .  CESAREAN SECTION    . DILATION AND CURETTAGE OF UTERUS    . TUBAL LIGATION       Allergies  Allergen Reactions  . Amoxicillin   . Penicillins       Family History  Problem Relation Age of Onset  . Hypertension Father   . Cancer Maternal Grandmother   . Cancer Paternal Grandmother      Social History Melissa Barrett reports that she has been smoking cigarettes.  She started smoking about 18 years ago. She has been smoking about 0.25 packs per day. She has never used smokeless tobacco. Melissa Barrett reports that she drinks alcohol.   Review of Systems CONSTITUTIONAL: No weight loss, fever, chills, weakness or fatigue.  HEENT: Eyes: No visual loss, blurred vision, double vision or yellow sclerae.No hearing loss, sneezing, congestion, runny nose or sore throat.  SKIN: No rash or itching.  CARDIOVASCULAR: per hpi RESPIRATORY: No shortness of breath, cough or sputum.  GASTROINTESTINAL: No anorexia, nausea, vomiting or diarrhea. No abdominal pain or blood.  GENITOURINARY: No burning on urination, no polyuria NEUROLOGICAL: No headache, dizziness, syncope, paralysis, ataxia, numbness or tingling in the extremities. No change in bowel or bladder control.  MUSCULOSKELETAL: No muscle, back pain, joint pain or stiffness.  LYMPHATICS: No enlarged nodes. No history of splenectomy.  PSYCHIATRIC: No history of depression or  anxiety.  ENDOCRINOLOGIC: No reports of sweating, cold or heat intolerance. No polyuria or polydipsia.  Marland Kitchen   Physical Examination Vitals:   04/14/17 1122  BP: 115/71  Pulse: 74  SpO2: 99%   Vitals:   04/14/17 1122  Weight: 179 lb 6.4 oz (81.4 kg)  Height: 5\' 4"  (1.626 m)    Gen: resting comfortably, no acute distress HEENT: no scleral icterus, pupils equal round and reactive, no palptable cervical adenopathy,  CV: RRR< no m/r/g, no jvd Resp: Clear to auscultation bilaterally GI: abdomen is soft, non-tender, non-distended, normal bowel sounds, no  hepatosplenomegaly MSK: extremities are warm, no edema.  Skin: warm, no rash Neuro:  no focal deficits Psych: appropriate affect   Diagnostic Studies     Assessment and Plan   1. Palpitations/PSVT - mild infrequent symptoms. We discussed increasing her dilt today but she is in favor of stating at current dose - continue to monitor, continue current meds   F/u 1 year          Antoine Poche, M.D.

## 2017-04-19 ENCOUNTER — Encounter: Payer: Self-pay | Admitting: Cardiology

## 2017-08-21 ENCOUNTER — Telehealth: Payer: Self-pay | Admitting: Cardiology

## 2017-08-21 NOTE — Telephone Encounter (Signed)
Advised step mother to have patient keep a check on BP and report back with any low numbers. Step mother states this only happened once

## 2017-08-21 NOTE — Telephone Encounter (Signed)
Patient has had issues with BP bottoming out.  Wanted to know if her medication could be causing it

## 2017-11-03 ENCOUNTER — Inpatient Hospital Stay (HOSPITAL_COMMUNITY)
Admission: AD | Admit: 2017-11-03 | Discharge: 2017-11-04 | DRG: 101 | Disposition: A | Discharge status: Home / Self Care | Payer: Medicaid Other | Source: Other Acute Inpatient Hospital | Attending: Family Medicine | Admitting: Family Medicine

## 2017-11-03 ENCOUNTER — Encounter (HOSPITAL_COMMUNITY): Payer: Self-pay | Admitting: Internal Medicine

## 2017-11-03 ENCOUNTER — Other Ambulatory Visit: Payer: Self-pay

## 2017-11-03 DIAGNOSIS — Z634 Disappearance and death of family member: Secondary | ICD-10-CM

## 2017-11-03 DIAGNOSIS — Z9114 Patient's other noncompliance with medication regimen: Secondary | ICD-10-CM

## 2017-11-03 DIAGNOSIS — I10 Essential (primary) hypertension: Secondary | ICD-10-CM | POA: Diagnosis present

## 2017-11-03 DIAGNOSIS — R569 Unspecified convulsions: Secondary | ICD-10-CM

## 2017-11-03 DIAGNOSIS — Z79899 Other long term (current) drug therapy: Secondary | ICD-10-CM

## 2017-11-03 DIAGNOSIS — F17210 Nicotine dependence, cigarettes, uncomplicated: Secondary | ICD-10-CM | POA: Diagnosis present

## 2017-11-03 DIAGNOSIS — Z880 Allergy status to penicillin: Secondary | ICD-10-CM

## 2017-11-03 DIAGNOSIS — Z23 Encounter for immunization: Secondary | ICD-10-CM

## 2017-11-03 DIAGNOSIS — G4089 Other seizures: Principal | ICD-10-CM | POA: Diagnosis present

## 2017-11-03 DIAGNOSIS — R001 Bradycardia, unspecified: Secondary | ICD-10-CM | POA: Diagnosis present

## 2017-11-03 DIAGNOSIS — Z881 Allergy status to other antibiotic agents status: Secondary | ICD-10-CM

## 2017-11-03 DIAGNOSIS — F1010 Alcohol abuse, uncomplicated: Secondary | ICD-10-CM | POA: Diagnosis present

## 2017-11-03 DIAGNOSIS — Z818 Family history of other mental and behavioral disorders: Secondary | ICD-10-CM

## 2017-11-03 HISTORY — DX: Unspecified convulsions: R56.9

## 2017-11-03 HISTORY — DX: Alcohol abuse, uncomplicated: F10.10

## 2017-11-03 HISTORY — DX: Essential (primary) hypertension: I10

## 2017-11-03 LAB — COMPREHENSIVE METABOLIC PANEL
ALK PHOS: 77 U/L (ref 38–126)
ALT: 9 U/L (ref 0–44)
AST: 23 U/L (ref 15–41)
Albumin: 3.8 g/dL (ref 3.5–5.0)
Anion gap: 6 (ref 5–15)
BUN: 11 mg/dL (ref 6–20)
CHLORIDE: 104 mmol/L (ref 98–111)
CO2: 29 mmol/L (ref 22–32)
CREATININE: 0.94 mg/dL (ref 0.44–1.00)
Calcium: 8.8 mg/dL — ABNORMAL LOW (ref 8.9–10.3)
GFR calc Af Amer: >60 mL/min (ref >60)
GFR calc non Af Amer: >60 mL/min (ref >60)
Glucose, Bld: 121 mg/dL — ABNORMAL HIGH (ref 70–99)
Potassium: 3.2 mmol/L — ABNORMAL LOW (ref 3.5–5.1)
Sodium: 139 mmol/L (ref 135–145)
Total Bilirubin: 1.1 mg/dL (ref 0.3–1.2)
Total Protein: 7.2 g/dL (ref 6.5–8.1)

## 2017-11-03 LAB — CBC
HEMATOCRIT: 35.6 % — AB (ref 36.0–46.0)
Hemoglobin: 12.5 g/dL (ref 12.0–15.0)
MCH: 31.2 pg (ref 26.0–34.0)
MCHC: 35.1 g/dL (ref 30.0–36.0)
MCV: 88.8 fL (ref 80.0–100.0)
Platelets: 245 10*3/uL (ref 150–400)
RBC: 4.01 MIL/uL (ref 3.87–5.11)
RDW: 13.2 % (ref 11.5–15.5)
WBC: 9.9 10*3/uL (ref 4.0–10.5)
nRBC: 0 % (ref 0.0–0.2)

## 2017-11-03 LAB — MAGNESIUM: Magnesium: 2 mg/dL (ref 1.7–2.4)

## 2017-11-03 MED ORDER — THIAMINE HCL 100 MG/ML IJ SOLN: 100.0000 mg | Freq: Every day | INTRAMUSCULAR | Status: DC

## 2017-11-03 MED ORDER — LORAZEPAM 2 MG/ML IJ SOLN: 1.0000 mg | INTRAMUSCULAR | Status: DC | PRN

## 2017-11-03 MED ORDER — INFLUENZA VAC SPLIT QUAD 0.5 ML IM SUSY
0.5000 mL | PREFILLED_SYRINGE | INTRAMUSCULAR | Status: AC
  Administered 2017-11-04: 0.5 mL via INTRAMUSCULAR
  Filled 2017-11-03: qty 0.5

## 2017-11-03 MED ORDER — LORAZEPAM 1 MG PO TABS: 1.0000 mg | ORAL_TABLET | Freq: Four times a day (QID) | ORAL | Status: DC | PRN

## 2017-11-03 MED ORDER — FOLIC ACID 1 MG PO TABS
1.0000 mg | ORAL_TABLET | Freq: Every day | ORAL | Status: DC
  Administered 2017-11-04: 1 mg via ORAL
  Filled 2017-11-03: qty 1

## 2017-11-03 MED ORDER — PNEUMOCOCCAL VAC POLYVALENT 25 MCG/0.5ML IJ INJ
0.5000 mL | INJECTION | INTRAMUSCULAR | Status: AC
  Administered 2017-11-04: 0.5 mL via INTRAMUSCULAR
  Filled 2017-11-03: qty 0.5

## 2017-11-03 MED ORDER — ONDANSETRON HCL 4 MG PO TABS: 4.0000 mg | ORAL_TABLET | Freq: Four times a day (QID) | ORAL | Status: DC | PRN

## 2017-11-03 MED ORDER — VITAMIN B-1 100 MG PO TABS
100.0000 mg | ORAL_TABLET | Freq: Every day | ORAL | Status: DC
  Administered 2017-11-04: 100 mg via ORAL
  Filled 2017-11-03: qty 1

## 2017-11-03 MED ORDER — ACETAMINOPHEN 325 MG PO TABS: 650.0000 mg | ORAL_TABLET | Freq: Four times a day (QID) | ORAL | Status: DC | PRN

## 2017-11-03 MED ORDER — ONDANSETRON HCL 4 MG/2ML IJ SOLN: 4.0000 mg | Freq: Four times a day (QID) | INTRAMUSCULAR | Status: DC | PRN

## 2017-11-03 MED ORDER — ENOXAPARIN SODIUM 40 MG/0.4ML ~~LOC~~ SOLN
40.0000 mg | SUBCUTANEOUS | Status: DC
  Administered 2017-11-04: 40 mg via SUBCUTANEOUS
  Filled 2017-11-03: qty 0.4

## 2017-11-03 MED ORDER — POLYETHYLENE GLYCOL 3350 17 G PO PACK: 17.0000 g | PACK | Freq: Every day | ORAL | Status: DC | PRN

## 2017-11-03 MED ORDER — LORAZEPAM 2 MG/ML IJ SOLN: 1.0000 mg | Freq: Four times a day (QID) | INTRAMUSCULAR | Status: DC | PRN

## 2017-11-03 MED ORDER — ADULT MULTIVITAMIN W/MINERALS CH
1.0000 | ORAL_TABLET | Freq: Every day | ORAL | Status: DC
  Administered 2017-11-04: 1 via ORAL
  Filled 2017-11-03: qty 1

## 2017-11-03 MED ORDER — ACETAMINOPHEN 650 MG RE SUPP: 650.0000 mg | Freq: Four times a day (QID) | RECTAL | Status: DC | PRN

## 2017-11-03 NOTE — H&P (Signed)
History and Physical    Julyssa Kyer ZOX:[id number] DOB: July 15, 1980 DOA: 11/03/2017  PCP: System, Provider Not In   Patient coming from: Direct admit from [facility].   Chief Complaint: Seizures  HPI: Melissa Barrett is a 37 y.o. female with medical history significant for seizures and HTN, who presented to [facility] ED with reports of seizures this morning at home, when patient was still lying in bed.  There was associated tongue biting. Patient was post- ictal at the time of EMS arrival.  Patient reports mild left-sided headache that started today.  She denies change in vision or weakness of her extremities. Denies respiratory, urinary tract or  gastrointestinal symptoms.  Reports she had a seizure last week, otherwise has not had a seizure in a while.  Reports her dose of Aptiom was changed few months ago.  Cannot remember when last she had an MRI or her last neurology visit.  Reports she was diagnosed with seizures ~one year ago.  She last took her seizure medication 2 days ago.  Patient reports she drinks 2 beers every day, per ED provider notes, and said she drank only on the weekends,but family was concerned about the amount of alcohol she drinks-she drinks a lot, since her brother committed suicide 1-1/2-year ago. No family present at bedside.  Patient then had another seizure episodes generalize tonic-clonic in the ED, and became postictal. she was subsequently given 1 g of Keppra and 1 mg Ativan.  Transfer notes from ED-remarkable CBC, BMP.  EKG sinus bradycardia rate 47, normal intervals.  Head CT paranasal sinus disease.  Review of Systems: As per HPI other systems reviewed and negative  Past Medical History:  Diagnosis Date  . Alcohol abuse   . HTN (hypertension)   . Seizures (HCC)    Family hx of Suicide- Brother.  Prior to Admission medications   Not on File    Physical Exam: Vitals:   11/03/17 2013 11/03/17 2033  BP: 106/76   Pulse: 93   Resp: 20   Temp:  98.7 F (37.1 C)   TempSrc: Oral   SpO2: 100% 96%  Weight: 89.8 kg   Height: 5\' 7"  (1.702 m)     Constitutional: Sleepy but calm, comfortable Vitals:   11/03/17 2013 11/03/17 2033  BP: 106/76   Pulse: 93   Resp: 20   Temp: 98.7 F (37.1 C)   TempSrc: Oral   SpO2: 100% 96%  Weight: 89.8 kg   Height: 5\' 7"  (1.702 m)    Eyes: PERRL, lids and conjunctivae normal ENMT: Mucous membranes are moist. Posterior pharynx clear of any exudate or lesions.Normal dentition.  Neck: normal, supple, no masses, no thyromegaly Respiratory: clear to auscultation bilaterally, no wheezing, no crackles. Normal respiratory effort. No accessory muscle use.  Cardiovascular: Regular rate and rhythm, no murmurs / rubs / gallops. No extremity edema. 2+ pedal pulses. No carotid bruits.  Abdomen: no tenderness, no masses palpated. No hepatosplenomegaly. Bowel sounds positive.  Musculoskeletal: no clubbing / cyanosis. No joint deformity upper and lower extremities. Good ROM, no contractures. Normal muscle tone.  Skin: no rashes, lesions, ulcers. No induration Neurologic: CN 2-12 grossly intact. Strength 5/5 in all 4.  Psychiatric: Normal judgment and insight. Alert and oriented x 3. Normal mood.   Labs on Admission: I have personally reviewed following labs and imaging studies.  Assessment/Plan Active Problems:   Seizures (HCC)   HTN (hypertension)  Uncontrolled seizures-likely related to alcohol abuse lowering seizure threshold and noncompliance with medication.  Head CT at [facility] showed paranasal sinus disease otherwise no acute intracranial abnormality.  -MRI brain -Will defer EEG with low yield and known history of seizures -Blood Alcohol level - NPO for now cept for sips with meds -Seizure precautions -Ativan PRN -Resume home Aptiom, can take home meds.  - CMP, CBC, Magnesium -Neurology consult order placed  HTN-soft to stable. -Hold home diltiazem.  Alcohol abuse-reports last drink  was 2 days ago.  Reports she drinks 2 beers a day but per EDP notes, family reports more alcohol intake.  - CIWA -Thiamine folate's -Serum Alcohol level  HIV as part of routine health screening.   DVT prophylaxis: Scds Code Status: Full Family Communication: None at bedside Disposition Plan: per rounding team Consults called: Neurology Admission status: Obs, Med-surg   Onnie Boer MD [facility] Pager [phone] From 6PM-2AM.  Otherwise please contact night-coverage [email or web address] Password [facility]  11/03/2017, 9:33 PM

## 2017-11-04 ENCOUNTER — Observation Stay (HOSPITAL_COMMUNITY): Payer: Medicaid Other

## 2017-11-04 DIAGNOSIS — Z881 Allergy status to other antibiotic agents status: Secondary | ICD-10-CM | POA: Diagnosis not present

## 2017-11-04 DIAGNOSIS — Z880 Allergy status to penicillin: Secondary | ICD-10-CM | POA: Diagnosis not present

## 2017-11-04 DIAGNOSIS — F17210 Nicotine dependence, cigarettes, uncomplicated: Secondary | ICD-10-CM | POA: Diagnosis present

## 2017-11-04 DIAGNOSIS — Z818 Family history of other mental and behavioral disorders: Secondary | ICD-10-CM | POA: Diagnosis not present

## 2017-11-04 DIAGNOSIS — Z9114 Patient's other noncompliance with medication regimen: Secondary | ICD-10-CM | POA: Diagnosis not present

## 2017-11-04 DIAGNOSIS — I10 Essential (primary) hypertension: Secondary | ICD-10-CM | POA: Diagnosis present

## 2017-11-04 DIAGNOSIS — F1010 Alcohol abuse, uncomplicated: Secondary | ICD-10-CM | POA: Diagnosis present

## 2017-11-04 DIAGNOSIS — R001 Bradycardia, unspecified: Secondary | ICD-10-CM | POA: Diagnosis present

## 2017-11-04 DIAGNOSIS — Z634 Disappearance and death of family member: Secondary | ICD-10-CM | POA: Diagnosis not present

## 2017-11-04 DIAGNOSIS — Z23 Encounter for immunization: Secondary | ICD-10-CM | POA: Diagnosis not present

## 2017-11-04 DIAGNOSIS — G4089 Other seizures: Secondary | ICD-10-CM | POA: Diagnosis present

## 2017-11-04 DIAGNOSIS — Z79899 Other long term (current) drug therapy: Secondary | ICD-10-CM | POA: Diagnosis not present

## 2017-11-04 LAB — ETHANOL

## 2017-11-04 MED ORDER — LEVETIRACETAM 500 MG PO TABS: 500.0000 mg | ORAL_TABLET | Freq: Two times a day (BID) | ORAL | Status: DC

## 2017-11-04 MED ORDER — ESLICARBAZEPINE ACETATE 600 MG PO TABS
600.0000 mg | ORAL_TABLET | Freq: Every day | ORAL | Status: DC
  Administered 2017-11-04: 600 mg via ORAL
  Filled 2017-11-04 (×2): qty 1

## 2017-11-04 MED ORDER — LEVETIRACETAM IN NACL 1000 MG/100ML IV SOLN
1000.0000 mg | Freq: Once | INTRAVENOUS | Status: DC
  Filled 2017-11-04: qty 100

## 2017-11-04 NOTE — Progress Notes (Signed)
Patient wanting to smoke a cigarette and walk downstairs. Patient instructed that she is not allowed to leave the floor. Patient wanting to sign an AMA paper to go home. Patient reported to me that she will call her doctor in the morning, but she is not staying tonight.

## 2017-11-04 NOTE — Progress Notes (Signed)
Patient refusing Keppra scheduled for this evening. Patient stated that she has medications in [facility]. Tiffany, AC notified. Patient instructed that she may pick up her home seizure medication tomorrow, 11/05/17 at 0730.Patient stated she does not need the seizure medication tonight and she would be back tomorrow.  Mid Level Notified of patient leaving AMA. IV removed. No signs of infection noted. Patient family member has arrived to drive her home.

## 2017-11-04 NOTE — Progress Notes (Signed)
Patient refusing bed alarm and educated on safety precautions.

## 2017-11-04 NOTE — Progress Notes (Signed)
PROGRESS NOTE  Melissa Barrett  ZOX:[id number] DOB: 08/17/1980 DOA: 11/03/2017 PCP: System, Provider Not In   Brief Narrative: Melissa Barrett is a 37 y.o. female with a history of seizure disorder beginning Oct 2018, alcohol use, and HTN who presented to [facility] ED with report of seizure with tongue biting at home witnessed by boyfriend. She was postictal on arrival and experienced tonic-clonic seizure activity in the ED. Keppra 1g loaded and ativan 1mg  given with termination of seizure. Head CT showed no acute intracranial abnormalities per report.   Assessment & Plan: Active Problems:   Seizures (HCC)   HTN (hypertension)  Seizure in patient with seizure disorder: Insufficient treatment due to incomplete adherence vs. needs escalation of AED Tx vs. related to EtOH withdrawal.  - Continue home AED for now and prn ativan for seizure like activity.  - ?if drug level would be helpful - Neurology consulted, no EEG yet ordered. - Follow up MRI brain. - Seizure precautions.   EtOH use: Denies overuse, has not had more than 3 drinks in a day in the past year, doesn't drink every day. CAGE questions 1 of 4. Negative blood level on admission. - Continue CIWA given risk of withdrawal, though could stop this in 24 hours if continues to exhibit no evidence of withdrawal.  - Cessation/moderation counseling provided - Continue thiamine, folate  Grief/bereavement: Reports symptoms began following the suicide of her younger brother and death of her father last year.  - Strongly recommend outpatient psychiatry follow up. No indication currently for inpatient evaluation.  HTN, sinus bradycardia:  - Hold diltiazem.   DVT prophylaxis: Lovenox Code Status: Full Family Communication: None at bedside Disposition Plan: Home once stable.  Consultants:   Neurology  Procedures:   None  Antimicrobials:  None   Subjective: Feels more like herself this morning. Tongue hurts from biting it  earlier. Very hungry, and frustrated with NPO status. Confirms history including missing a dose of AED though this is very unusual for her.  Objective: Vitals:   11/03/17 2013 11/03/17 2033 11/03/17 2210 11/04/17 0634  BP: 106/76  99/67 124/77  Pulse: 93  86 68  Resp: 20  20 20   Temp: 98.7 F (37.1 C)  98.5 F (36.9 C) 97.7 F (36.5 C)  TempSrc: Oral  Oral Oral  SpO2: 100% 96% 97% 100%  Weight: 89.8 kg     Height: 5\' 7"  (1.702 m)      No intake or output data in the 24 hours ending 11/04/17 1447 Filed Weights   11/03/17 2013  Weight: 89.8 kg    Gen: 37 y.o. female in no distress  Pulm: Non-labored breathing room air. Clear to auscultation bilaterally.  CV: Regular rate and rhythm. No murmur, rub, or gallop. No JVD, no pedal edema. GI: Abdomen soft, non-tender, non-distended, with normoactive bowel sounds. No organomegaly or masses felt. Ext: Warm, no deformities Skin: No rashes, lesions or ulcers Neuro: Alert and oriented. No asterixis, tremor, or focal neurological deficits. Psych: Judgement and insight appear normal. Mood & affect appropriate.   Data Reviewed: I have personally reviewed following labs and imaging studies  CBC: Recent Labs  Lab 11/03/17 2154  WBC 9.9  HGB 12.5  HCT 35.6*  MCV 88.8  PLT 245   Basic Metabolic Panel: Recent Labs  Lab 11/03/17 2154  NA 139  K 3.2*  CL 104  CO2 29  GLUCOSE 121*  BUN 11  CREATININE 0.94  CALCIUM 8.8*  MG 2.0   GFR: Estimated  Creatinine Clearance: 95.2 mL/min (by C-G formula based on SCr of 0.94 mg/dL). Liver Function Tests: Recent Labs  Lab 11/03/17 2154  AST 23  ALT 9  ALKPHOS 77  BILITOT 1.1  PROT 7.2  ALBUMIN 3.8   No results for input(s): LIPASE, AMYLASE in the last 168 hours. No results for input(s): AMMONIA in the last 168 hours. Coagulation Profile: No results for input(s): INR, PROTIME in the last 168 hours. Cardiac Enzymes: No results for input(s): CKTOTAL, CKMB, CKMBINDEX, TROPONINI in  the last 168 hours. BNP (last 3 results) No results for input(s): PROBNP in the last 8760 hours. HbA1C: No results for input(s): HGBA1C in the last 72 hours. CBG: No results for input(s): GLUCAP in the last 168 hours. Lipid Profile: No results for input(s): CHOL, HDL, LDLCALC, TRIG, CHOLHDL, LDLDIRECT in the last 72 hours. Thyroid Function Tests: No results for input(s): TSH, T4TOTAL, FREET4, T3FREE, THYROIDAB in the last 72 hours. Anemia Panel: No results for input(s): VITAMINB12, FOLATE, FERRITIN, TIBC, IRON, RETICCTPCT in the last 72 hours. Urine analysis: No results found for: COLORURINE, APPEARANCEUR, LABSPEC, PHURINE, GLUCOSEU, HGBUR, BILIRUBINUR, KETONESUR, PROTEINUR, UROBILINOGEN, NITRITE, LEUKOCYTESUR No results found for this or any previous visit (from the past 240 hour(s)).    Radiology Studies: Mr Brain Wo Contrast  Result Date: 11/04/2017 CLINICAL DATA:  Seizures.  History of alcohol abuse. EXAM: MRI HEAD WITHOUT CONTRAST TECHNIQUE: Multiplanar, multiecho pulse sequences of the brain and surrounding structures were obtained without intravenous contrast. COMPARISON:  None. FINDINGS: There is varying motion artifact throughout the examination with some sequences being severely degraded. Brain: Diffusion imaging is of diagnostic quality, and there is no evidence of acute infarct. No intracranial hemorrhage, mass, midline shift, or extra-axial fluid collection is identified. The ventricles and sulci are normal. No significant white matter disease is evident. Dedicated coronal oblique imaging through the temporal lobes is nondiagnostic due to severe motion artifact. Vascular: Major intracranial vascular flow voids are preserved. Skull and upper cervical spine: No suspicious marrow lesion. Sinuses/Orbits: Circumferential right maxillary sinus mucosal thickening. Minimal right ethmoid air cell mucosal thickening. No significant mastoid fluid. Other: None. IMPRESSION: Motion degraded  examination without acute intracranial abnormality or gross cause of seizures identified. Electronically Signed   By: Sebastian Ache M.D.   On: 11/04/2017 10:45    Scheduled Meds: . enoxaparin (LOVENOX) injection  40 mg Subcutaneous Q24H  . Eslicarbazepine Acetate  600 mg Oral Daily  . folic acid  1 mg Oral Daily  . multivitamin with minerals  1 tablet Oral Daily  . thiamine  100 mg Oral Daily   Or  . thiamine  100 mg Intravenous Daily   Continuous Infusions:   LOS: 1 day   Time spent: 25 minutes.  Tyrone Nine, MD [facility] [email or web address] Password [facility] 11/04/2017, 2:47 PM

## 2017-11-04 NOTE — Consult Note (Signed)
Celebration A. Merlene Laughter, MD     [email or web address]          Melissa Barrett is an 37 y.o. female.   ASSESSMENT/PLAN: 1.  Resolved status epilepticus: The patient is suspected of having epileptic seizures.  She will be restarted on Keppra.  She was given a loading dose yesterday about 24 hours ago.  This will be redone and the maintenance dose of 500 mg twice a day will also be restarted.  Will continue with her baseline aptiom.  Both these medications are in the low dose range and should be escalated to efficacy.  This should be done in outpatient setting.  There has been concerned about drowsiness but I think we should first adjust the more urgent problem which is seizure control.  Repeat EEG will be obtained.  She is a precaution is recommended.  Alcohol cessation is also recommended although the patient indicates that she does not consume much alcohol.    2.  Alcohol use questionable alcohol withdrawal seizures: At this time this is not well established and therefore the patient should be treated if she has epileptic seizures.     Patient is a 37 year old black female who has a one-year history of seizures.  She reports that she has had approximately 6 seizures in the last year.  The semiology of her seizures seem to be stereotyped.  She reports having an abnormal burning smell followed by hot-like sensation over her body when she loses consciousness.  She is reported to have generalized tonic-clonic activity.  Seizures almost also always associated with tongue biting.  She is amnestic to the event.  There is no significant history of head trauma.  She seemed to have significant short-term memory.  This limits the history.  There is no history of prematurity or CNS infections.  Her uncle had a history of seizures and unfortunately died from complications of this.  There is questioned about alcohol use although she categorically denies drinking daily and reports that when she  drinks only about 2 beers.  There has been questions about patient drinking more but family is not around to corroborate this.  The review of systems otherwise negative.    GENERAL: This is a very pleasant female in no acute distress.  HEENT: There is trauma from biting of the tongue on both sides.  Neck is supple.  There is stigmata associated with chronic nicotine use.  ABDOMEN: soft  EXTREMITIES: No edema   BACK: Normal  SKIN: Normal by inspection.    MENTAL STATUS: Alert and oriented. Speech, language and cognition are generally intact. Judgment and insight normal.   CRANIAL NERVES: Pupils are equal, round and reactive to light and accomodation; extra ocular movements are full, there is no significant nystagmus; visual fields are full; upper and lower facial muscles are normal in strength and symmetric, there is no flattening of the nasolabial folds; tongue is midline; uvula is midline; shoulder elevation is normal.  MOTOR: Normal tone, bulk and strength; no pronator drift.  COORDINATION: Left finger to nose is normal, right finger to nose is normal, No rest tremor; no intention tremor; no postural tremor; no bradykinesia.  REFLEXES: Deep tendon reflexes are symmetrical and normal. Plantar reflexes are flexor bilaterally.   SENSATION: Normal to light touch, temperature, and pain.     Blood pressure 114/71, pulse 86, temperature 98.9 F (37.2 C), temperature source Oral, resp. rate 20, height 5' 7"  (1.702 m), weight 89.8 kg, SpO2 98 %.  Past Medical  History:  Diagnosis Date  . Alcohol abuse   . HTN (hypertension)   . Seizures ([place])     History reviewed. No pertinent surgical history.  History reviewed. No pertinent family history.  Social History:  reports that she has been smoking. She has never used smokeless tobacco. She reports that she drinks about 15.0 standard drinks of alcohol per week. She reports that she does not use drugs.  Allergies:  Allergies    Allergen Reactions  . Amoxicillin Hives and Itching  . Penicillins Hives and Itching    Has patient had a PCN reaction causing immediate rash, facial/tongue/throat swelling, SOB or lightheadedness with hypotension: No Has patient had a PCN reaction causing severe rash involving mucus membranes or skin necrosis: No Has patient had a PCN reaction that required hospitalization: No Has patient had a PCN reaction occurring within the last 10 years: No If all of the above answers are "NO", then may proceed with Cephalosporin use.     Medications: Prior to Admission medications   Medication Sig Start Date End Date Taking? Authorizing Provider  cyclobenzaprine (FLEXERIL) 10 MG tablet Take 10 mg by mouth every 8 (eight) hours as needed for muscle spasms.   Yes [provider]  diltiazem (TIAZAC) 180 MG 24 hr capsule Take 180 mg by mouth daily.   Yes [provider]  Eslicarbazepine Acetate (APTIOM) 600 MG TABS Take 600 mg by mouth daily. For seizures   Yes [provider]  hydrOXYzine (VISTARIL) 25 MG capsule Take 25-50 mg by mouth every 8 (eight) hours as needed (for sleep).    Yes [provider]  ibuprofen (ADVIL,MOTRIN) 800 MG tablet Take 800 mg by mouth every 8 (eight) hours as needed for mild pain or moderate pain.   Yes [provider]    Scheduled Meds: . enoxaparin (LOVENOX) injection  40 mg Subcutaneous Q24H  . Eslicarbazepine Acetate  600 mg Oral Daily  . folic acid  1 mg Oral Daily  . multivitamin with minerals  1 tablet Oral Daily  . thiamine  100 mg Oral Daily   Or  . thiamine  100 mg Intravenous Daily   Continuous Infusions: PRN Meds:.acetaminophen **OR** acetaminophen, LORazepam, LORazepam **OR** LORazepam, ondansetron **OR** ondansetron (ZOFRAN) IV, polyethylene glycol     Results for orders placed or performed during the hospital encounter of 11/03/17 (from the past 48 hour(s))  Comprehensive metabolic panel     Status:  Abnormal   Collection Time: 11/03/17  9:54 PM  Result Value Ref Range   Sodium 139 135 - 145 mmol/L   Potassium 3.2 (L) 3.5 - 5.1 mmol/L   Chloride 104 98 - 111 mmol/L   CO2 29 22 - 32 mmol/L   Glucose, Bld 121 (H) 70 - 99 mg/dL   BUN 11 6 - 20 mg/dL   Creatinine, Ser 0.94 0.44 - 1.00 mg/dL   Calcium 8.8 (L) 8.9 - 10.3 mg/dL   Total Protein 7.2 6.5 - 8.1 g/dL   Albumin 3.8 3.5 - 5.0 g/dL   AST 23 15 - 41 U/L   ALT 9 0 - 44 U/L   Alkaline Phosphatase 77 38 - 126 U/L   Total Bilirubin 1.1 0.3 - 1.2 mg/dL   GFR calc non Af Amer >60 >60 mL/min   GFR calc Af Amer >60 >60 mL/min    Comment: (NOTE) The eGFR has been calculated using the CKD EPI equation. This calculation has not been validated in all clinical situations. eGFR's persistently <60  mL/min signify possible Chronic Kidney Disease.    Anion gap 6 5 - 15    Comment: Performed at [facility], [address], [place], [place] [address]  CBC     Status: Abnormal   Collection Time: 11/03/17  9:54 PM  Result Value Ref Range   WBC 9.9 4.0 - 10.5 K/uL   RBC 4.01 3.87 - 5.11 MIL/uL   Hemoglobin 12.5 12.0 - 15.0 g/dL   HCT 35.6 (L) 36.0 - 46.0 %   MCV 88.8 80.0 - 100.0 fL   MCH 31.2 26.0 - 34.0 pg   MCHC 35.1 30.0 - 36.0 g/dL   RDW 13.2 11.5 - 15.5 %   Platelets 245 150 - 400 K/uL   nRBC 0.0 0.0 - 0.2 %    Comment: Performed at [facility], [address] [place], [place] [address]  Magnesium     Status: None   Collection Time: 11/03/17  9:54 PM  Result Value Ref Range   Magnesium 2.0 1.7 - 2.4 mg/dL    Comment: Performed at [facility], [address] [place], [place] [address]  Ethanol     Status: None   Collection Time: 11/03/17 10:15 PM  Result Value Ref Range   Alcohol, Ethyl (B) <10 <10 mg/dL    Comment: Performed at [facility], [address] [place], [place] [address]    Studies/Results:  BRAIN MRI FINDINGS: There is varying motion artifact throughout the examination with some sequences being severely  degraded.  Brain: Diffusion imaging is of diagnostic quality, and there is no evidence of acute infarct. No intracranial hemorrhage, mass, midline shift, or extra-axial fluid collection is identified. The ventricles and sulci are normal. No significant white matter disease is evident. Dedicated coronal oblique imaging through the temporal lobes is nondiagnostic due to severe motion artifact.  Vascular: Major intracranial vascular flow voids are preserved.  Skull and upper cervical spine: No suspicious marrow lesion.  Sinuses/Orbits: Circumferential right maxillary sinus mucosal thickening. Minimal right ethmoid air cell mucosal thickening. No significant mastoid fluid.  Other: None.  IMPRESSION: Motion degraded examination without acute intracranial abnormality or gross cause of seizures identified.    THE BRAIN MRI SCAN IS REVIEWED IN PERSON.  NO ACUTE FINDINGS ARE NOTED ON DWI.  NO HEMORRHAGES APPRECIATED.  NO WHITE MATTER LESIONS OBSERVED.  CORONAL T2 IS SEVERELY DEGRADED BY MOTION ARTIFACT.  HOWEVER, CORONAL DWI IS OBTAINED AND THE SEEMED TO SUGGEST A POSSIBILITY OF SMALLER MESIAL TEMPORAL AREA ON THE RIGHT.      Tonna Palazzi A. Merlene Laughter, M.D.  Diplomate, Tax adviser of Psychiatry and Neurology ( Neurology). 11/04/2017, 5:43 PM

## 2017-11-05 LAB — HIV ANTIBODY (ROUTINE TESTING W REFLEX): HIV Screen 4th Generation wRfx: NONREACTIVE

## 2017-11-11 NOTE — Discharge Summary (Signed)
Physician Discharge Summary  [name] ZOX:[id number] DOB: August 25, 1980 DOA: 11/03/2017  PCP: System, Provider Not In  Admit date: 11/03/2017 Discharge date: 11/11/2017  Admitted From: Home Disposition: Home  Recommendations for Outpatient Follow-up:  1. Follow up with PCP in 1-2 weeks 2. Follow up with neurology for repeat EEG, added keppra to aptiom per neurology recommendations.  Home Health: None Equipment/Devices: None Discharge Condition: Improved  CODE STATUS: Full Diet recommendation: Heart healthy  Brief/Interim Summary: Melissa Barrett is a 37 y.o. female with a history of seizure disorder beginning Oct 2018, alcohol use, and HTN who presented to [facility] ED with report of seizure with tongue biting at home witnessed by boyfriend. She was postictal on arrival and experienced tonic-clonic seizure activity in the ED. Keppra 1g loaded and ativan 1mg  given with termination of seizure. Head CT showed no acute intracranial abnormalities per report.   Discharge Diagnoses:  Active Problems:   Seizures (HCC)   HTN (hypertension)  Seizure in patient with seizure disorder: Insufficient treatment due to incomplete adherence vs. needs escalation of AED Tx vs. related to EtOH withdrawal.  - Neurology consulted, recommends outpatient titration of seizure medications, repeat EEG.  EtOH use: Denies overuse, has not had more than 3 drinks in a day in the past year, doesn't drink every day. CAGE questions 1 of 4. Negative blood level on admission. - Exhibits no evidence of withdrawal.  - Cessation/moderation counseling provided  Grief/bereavement: Reports symptoms began following the suicide of her younger brother and death of her father last year.  - Strongly recommend outpatient psychiatry follow up. No indication currently for inpatient evaluation.  HTN, sinus bradycardia:  - Continue home medications  Discharge Instructions  Allergies as of 11/04/2017      Reactions   Amoxicillin Hives, Itching   Penicillins Hives, Itching   Has patient had a PCN reaction causing immediate rash, facial/tongue/throat swelling, SOB or lightheadedness with hypotension: No Has patient had a PCN reaction causing severe rash involving mucus membranes or skin necrosis: No Has patient had a PCN reaction that required hospitalization: No Has patient had a PCN reaction occurring within the last 10 years: No If all of the above answers are "NO", then may proceed with Cephalosporin use.      Medication List    ASK your doctor about these medications   APTIOM 600 MG Tabs Generic drug:  Eslicarbazepine Acetate Take 600 mg by mouth daily. For seizures   cyclobenzaprine 10 MG tablet Commonly known as:  FLEXERIL Take 10 mg by mouth every 8 (eight) hours as needed for muscle spasms.   diltiazem 180 MG 24 hr capsule Commonly known as:  TIAZAC Take 180 mg by mouth daily.   hydrOXYzine 25 MG capsule Commonly known as:  VISTARIL Take 25-50 mg by mouth every 8 (eight) hours as needed (for sleep).   ibuprofen 800 MG tablet Commonly known as:  ADVIL,MOTRIN Take 800 mg by mouth every 8 (eight) hours as needed for mild pain or moderate pain.       Allergies  Allergen Reactions  . Amoxicillin Hives and Itching  . Penicillins Hives and Itching    Has patient had a PCN reaction causing immediate rash, facial/tongue/throat swelling, SOB or lightheadedness with hypotension: No Has patient had a PCN reaction causing severe rash involving mucus membranes or skin necrosis: No Has patient had a PCN reaction that required hospitalization: No Has patient had a PCN reaction occurring within the last 10 years: No If all of the above  answers are "NO", then may proceed with Cephalosporin use.     Consultations:  Neurology  Procedures/Studies: Mr Brain Wo Contrast  Result Date: 11/04/2017 CLINICAL DATA:  Seizures.  History of alcohol abuse. EXAM: MRI HEAD WITHOUT CONTRAST TECHNIQUE:  Multiplanar, multiecho pulse sequences of the brain and surrounding structures were obtained without intravenous contrast. COMPARISON:  None. FINDINGS: There is varying motion artifact throughout the examination with some sequences being severely degraded. Brain: Diffusion imaging is of diagnostic quality, and there is no evidence of acute infarct. No intracranial hemorrhage, mass, midline shift, or extra-axial fluid collection is identified. The ventricles and sulci are normal. No significant white matter disease is evident. Dedicated coronal oblique imaging through the temporal lobes is nondiagnostic due to severe motion artifact. Vascular: Major intracranial vascular flow voids are preserved. Skull and upper cervical spine: No suspicious marrow lesion. Sinuses/Orbits: Circumferential right maxillary sinus mucosal thickening. Minimal right ethmoid air cell mucosal thickening. No significant mastoid fluid. Other: None. IMPRESSION: Motion degraded examination without acute intracranial abnormality or gross cause of seizures identified. Electronically Signed   By: Sebastian Ache M.D.   On: 11/04/2017 10:45     Subjective: Feels more like herself this morning. Tongue hurts from biting it earlier. Very hungry, and frustrated with NPO status. Confirms history including missing a dose of AED though this is very unusual for her.  Discharge Exam: Vitals:   11/04/17 0634 11/04/17 1605  BP: 124/77 114/71  Pulse: 68 86  Resp: 20   Temp: 97.7 F (36.5 C) 98.9 F (37.2 C)  SpO2: 100% 98%   Gen: 37 y.o. female in no distress  Pulm: Non-labored breathing room air. Clear to auscultation bilaterally.  CV: Regular rate and rhythm. No murmur, rub, or gallop. No JVD, no pedal edema. GI: Abdomen soft, non-tender, non-distended, with normoactive bowel sounds. No organomegaly or masses felt. Ext: Warm, no deformities Skin: No rashes, lesions or ulcers Neuro: Alert and oriented. No asterixis, tremor, or focal  neurological deficits. Psych: Judgement and insight appear normal. Mood & affect appropriate.  Labs:  Time coordinating discharge: Approximately 40 minutes  Tyrone Nine, MD  [facility] 11/11/2017, 10:55 AM Pager [phone]

## 2017-11-26 ENCOUNTER — Other Ambulatory Visit (HOSPITAL_BASED_OUTPATIENT_CLINIC_OR_DEPARTMENT_OTHER): Payer: Self-pay

## 2017-11-26 DIAGNOSIS — G4733 Obstructive sleep apnea (adult) (pediatric): Secondary | ICD-10-CM

## 2017-11-29 ENCOUNTER — Encounter (INDEPENDENT_AMBULATORY_CARE_PROVIDER_SITE_OTHER): Payer: Self-pay

## 2017-11-29 ENCOUNTER — Ambulatory Visit: Payer: Medicaid Other | Attending: Neurology | Admitting: Neurology

## 2017-11-29 DIAGNOSIS — G4733 Obstructive sleep apnea (adult) (pediatric): Secondary | ICD-10-CM | POA: Insufficient documentation

## 2017-12-03 NOTE — Procedures (Signed)
   [facility] Nashali Ditmer A. Gerilyn Pilgrimoonquah, MD     [email or web address]             NOCTURNAL POLYSOMNOGRAPHY   LOCATION: [facility]   Patient Name: Melissa MassonFax, Annayah Study Date: 11/29/2017 Gender: Female D.O.B: Sep 29, 1980 Age (years): 37 Referring Provider: Beryle BeamsKofi Hanya Guerin MD, ABSM Height (inches): 64 Interpreting Physician: Beryle BeamsKofi Soledad Budreau MD, ABSM Weight (lbs): 179 RPSGT: Alfonso EllisHedrick, Debra BMI: 31 MRN: [id number] Neck Size: 15.00 CLINICAL INFORMATION Sleep Study Type: NPSG     Indication for sleep study: N/A     Epworth Sleepiness Score: 8     SLEEP STUDY TECHNIQUE As per the AASM Manual for the Scoring of Sleep and Associated Events v2.3 (April 2016) with a hypopnea requiring 4% desaturations.  The channels recorded and monitored were frontal, central and occipital EEG, electrooculogram (EOG), submentalis EMG (chin), nasal and oral airflow, thoracic and abdominal wall motion, anterior tibialis EMG, snore microphone, electrocardiogram, and pulse oximetry.  MEDICATIONS Medications self-administered by patient taken the night of the study : N/A  Current Outpatient Medications:  .  diltiazem (CARDIZEM CD) 180 MG 24 hr capsule, Take 1 capsule (180 mg total) by mouth daily., Disp: 90 capsule, Rfl: 3 .  hydrOXYzine (ATARAX/VISTARIL) 25 MG tablet, Take 25-50 mg by mouth every 8 (eight) hours as needed., Disp: , Rfl:  .  ibuprofen (ADVIL,MOTRIN) 400 MG tablet, Take 400 mg by mouth every 8 (eight) hours as needed., Disp: , Rfl:  .  levETIRAcetam (KEPPRA) 500 MG tablet, Take 1,000 mg by mouth at bedtime. , Disp: , Rfl: 0     SLEEP ARCHITECTURE The study was initiated at 9:54:27 PM and ended at 4:54:19 AM.  Sleep onset time was 142.7 minutes and the sleep efficiency was 60.0%%. The total sleep time was 252 minutes.  Stage REM latency was 97.0 minutes.  The patient spent 6.2%% of the night in stage N1 sleep, 51.4%% in stage N2 sleep, 25.2%% in stage N3 and 17.3% in  REM.  Alpha intrusion was absent.  Supine sleep was 0.00%.  RESPIRATORY PARAMETERS The overall apnea/hypopnea index (AHI) was 2.6 per hour. There were 4 total apneas, including 4 obstructive, 0 central and 0 mixed apneas. There were 7 hypopneas and 0 RERAs.  The AHI during Stage REM sleep was 9.7 per hour.  AHI while supine was N/A per hour.  The mean oxygen saturation was 95.0%. The minimum SpO2 during sleep was 87.0%.  loud snoring was noted during this study.  CARDIAC DATA The 2 lead EKG demonstrated sinus rhythm. The mean heart rate was 70.1 beats per minute. Other EKG findings include: None. LEG MOVEMENT DATA The total PLMS were 0 with a resulting PLMS index of 0.0. Associated arousal with leg movement index was 0.0.  IMPRESSIONS 1. The study shows reduced sleep efficiency.  Otherwise, the recording is unrevealing.  Argie RammingKofi A Deneise Getty, MD Diplomate, American Board of Sleep Medicine.  ELECTRONICALLY SIGNED ON:  12/03/2017, 3:12 PM [facility] PH: [phone]   FX: [phone] ACCREDITED BY THE AMERICAN ACADEMY OF SLEEP MEDICINE

## 2018-01-11 ENCOUNTER — Other Ambulatory Visit: Payer: Self-pay | Admitting: Cardiology

## 2018-03-17 ENCOUNTER — Observation Stay (HOSPITAL_COMMUNITY)
Admission: AD | Admit: 2018-03-17 | Discharge: 2018-03-18 | Disposition: A | Discharge status: Home / Self Care | Payer: Medicaid Other | Source: Other Acute Inpatient Hospital | Attending: Family Medicine | Admitting: Family Medicine

## 2018-03-17 ENCOUNTER — Observation Stay (HOSPITAL_COMMUNITY)
Admission: AD | Admit: 2018-03-17 | Discharge: 2018-03-17 | Disposition: A | Discharge status: Home / Self Care | Payer: Medicaid Other | Attending: Family Medicine | Admitting: Family Medicine

## 2018-03-17 ENCOUNTER — Encounter (HOSPITAL_COMMUNITY): Payer: Self-pay

## 2018-03-17 ENCOUNTER — Other Ambulatory Visit: Payer: Self-pay

## 2018-03-17 DIAGNOSIS — I110 Hypertensive heart disease with heart failure: Secondary | ICD-10-CM | POA: Insufficient documentation

## 2018-03-17 DIAGNOSIS — R569 Unspecified convulsions: Secondary | ICD-10-CM

## 2018-03-17 DIAGNOSIS — F17210 Nicotine dependence, cigarettes, uncomplicated: Secondary | ICD-10-CM | POA: Insufficient documentation

## 2018-03-17 DIAGNOSIS — E860 Dehydration: Secondary | ICD-10-CM | POA: Diagnosis not present

## 2018-03-17 DIAGNOSIS — Z791 Long term (current) use of non-steroidal anti-inflammatories (NSAID): Secondary | ICD-10-CM | POA: Diagnosis not present

## 2018-03-17 DIAGNOSIS — F1010 Alcohol abuse, uncomplicated: Secondary | ICD-10-CM | POA: Insufficient documentation

## 2018-03-17 DIAGNOSIS — Z880 Allergy status to penicillin: Secondary | ICD-10-CM | POA: Diagnosis not present

## 2018-03-17 DIAGNOSIS — G40909 Epilepsy, unspecified, not intractable, without status epilepticus: Principal | ICD-10-CM | POA: Insufficient documentation

## 2018-03-17 DIAGNOSIS — Z79899 Other long term (current) drug therapy: Secondary | ICD-10-CM | POA: Insufficient documentation

## 2018-03-17 DIAGNOSIS — Z720 Tobacco use: Secondary | ICD-10-CM | POA: Diagnosis present

## 2018-03-17 DIAGNOSIS — I5022 Chronic systolic (congestive) heart failure: Secondary | ICD-10-CM | POA: Diagnosis not present

## 2018-03-17 DIAGNOSIS — O903 Peripartum cardiomyopathy: Secondary | ICD-10-CM | POA: Diagnosis present

## 2018-03-17 DIAGNOSIS — F17200 Nicotine dependence, unspecified, uncomplicated: Secondary | ICD-10-CM | POA: Diagnosis present

## 2018-03-17 LAB — MRSA PCR SCREENING: MRSA BY PCR: NEGATIVE

## 2018-03-17 LAB — RAPID URINE DRUG SCREEN, HOSP PERFORMED
Amphetamines: NOT DETECTED
BARBITURATES: NOT DETECTED
Benzodiazepines: NOT DETECTED
Cocaine: NOT DETECTED
Opiates: POSITIVE — AB
Tetrahydrocannabinol: NOT DETECTED

## 2018-03-17 MED ORDER — DILTIAZEM HCL ER COATED BEADS 180 MG PO CP24
180.0000 mg | ORAL_CAPSULE | Freq: Every day | ORAL | Status: DC
  Administered 2018-03-17 – 2018-03-18 (×2): 180 mg via ORAL
  Filled 2018-03-17 (×2): qty 1

## 2018-03-17 MED ORDER — ALBUTEROL SULFATE (2.5 MG/3ML) 0.083% IN NEBU: 2.5000 mg | INHALATION_SOLUTION | RESPIRATORY_TRACT | Status: DC | PRN

## 2018-03-17 MED ORDER — LORAZEPAM 1 MG PO TABS: 1.0000 mg | ORAL_TABLET | Freq: Four times a day (QID) | ORAL | Status: DC | PRN

## 2018-03-17 MED ORDER — HEPARIN SODIUM (PORCINE) 5000 UNIT/ML IJ SOLN
5000.0000 [IU] | Freq: Three times a day (TID) | INTRAMUSCULAR | Status: DC
  Administered 2018-03-17 – 2018-03-18 (×3): 5000 [IU] via SUBCUTANEOUS
  Filled 2018-03-17 (×2): qty 1

## 2018-03-17 MED ORDER — NON FORMULARY: 4.0000 mg | Freq: Every day | Status: DC

## 2018-03-17 MED ORDER — LEVETIRACETAM 500 MG PO TABS: 1000.0000 mg | ORAL_TABLET | Freq: Every day | ORAL | Status: DC

## 2018-03-17 MED ORDER — LORAZEPAM 2 MG/ML IJ SOLN: 1.0000 mg | INTRAMUSCULAR | Status: DC | PRN

## 2018-03-17 MED ORDER — LEVETIRACETAM IN NACL 1000 MG/100ML IV SOLN
1000.0000 mg | Freq: Once | INTRAVENOUS | Status: AC
  Administered 2018-03-17: 1000 mg via INTRAVENOUS
  Filled 2018-03-17: qty 100

## 2018-03-17 MED ORDER — VITAMIN B-1 100 MG PO TABS
100.0000 mg | ORAL_TABLET | Freq: Every day | ORAL | Status: DC
  Administered 2018-03-17 – 2018-03-18 (×2): 100 mg via ORAL
  Filled 2018-03-17 (×2): qty 1

## 2018-03-17 MED ORDER — FOLIC ACID 1 MG PO TABS
1.0000 mg | ORAL_TABLET | Freq: Every day | ORAL | Status: DC
  Administered 2018-03-17 – 2018-03-18 (×2): 1 mg via ORAL
  Filled 2018-03-17 (×2): qty 1

## 2018-03-17 MED ORDER — LORAZEPAM 2 MG/ML IJ SOLN: 1.0000 mg | Freq: Four times a day (QID) | INTRAMUSCULAR | Status: DC | PRN

## 2018-03-17 MED ORDER — LEVETIRACETAM IN NACL 500 MG/100ML IV SOLN: 500.0000 mg | Freq: Two times a day (BID) | INTRAVENOUS | Status: DC

## 2018-03-17 MED ORDER — POLYETHYLENE GLYCOL 3350 17 G PO PACK: 17.0000 g | PACK | Freq: Every day | ORAL | Status: DC | PRN

## 2018-03-17 MED ORDER — NON FORMULARY: 800.0000 mg | Freq: Every day | Status: DC

## 2018-03-17 MED ORDER — SODIUM CHLORIDE 0.9 % IV SOLN: 250.0000 mL | INTRAVENOUS | Status: DC | PRN

## 2018-03-17 MED ORDER — VALPROATE SODIUM 500 MG/5ML IV SOLN
500.0000 mg | Freq: Three times a day (TID) | INTRAVENOUS | Status: DC
  Administered 2018-03-17 – 2018-03-18 (×4): 500 mg via INTRAVENOUS
  Filled 2018-03-17 (×7): qty 5

## 2018-03-17 MED ORDER — NICOTINE 14 MG/24HR TD PT24
14.0000 mg | MEDICATED_PATCH | Freq: Every day | TRANSDERMAL | Status: DC
  Administered 2018-03-17 – 2018-03-18 (×2): 14 mg via TRANSDERMAL
  Filled 2018-03-17 (×2): qty 1

## 2018-03-17 MED ORDER — ONDANSETRON HCL 4 MG/2ML IJ SOLN: 4.0000 mg | Freq: Four times a day (QID) | INTRAMUSCULAR | Status: DC | PRN

## 2018-03-17 MED ORDER — SODIUM CHLORIDE 0.9% FLUSH
3.0000 mL | Freq: Two times a day (BID) | INTRAVENOUS | Status: DC
  Administered 2018-03-17 – 2018-03-18 (×3): 3 mL via INTRAVENOUS

## 2018-03-17 MED ORDER — ADULT MULTIVITAMIN W/MINERALS CH
1.0000 | ORAL_TABLET | Freq: Every day | ORAL | Status: DC
  Administered 2018-03-17 – 2018-03-18 (×2): 1 via ORAL
  Filled 2018-03-17 (×2): qty 1

## 2018-03-17 MED ORDER — SODIUM CHLORIDE 0.9% FLUSH: 3.0000 mL | INTRAVENOUS | Status: DC | PRN

## 2018-03-17 MED ORDER — ESLICARBAZEPINE ACETATE 800 MG PO TABS: 800.0000 mg | ORAL_TABLET | Freq: Every day | ORAL | Status: DC

## 2018-03-17 MED ORDER — ACETAMINOPHEN 650 MG RE SUPP: 650.0000 mg | Freq: Four times a day (QID) | RECTAL | Status: DC | PRN

## 2018-03-17 MED ORDER — ONDANSETRON HCL 4 MG PO TABS: 4.0000 mg | ORAL_TABLET | Freq: Four times a day (QID) | ORAL | Status: DC | PRN

## 2018-03-17 MED ORDER — FOLIC ACID 1 MG PO TABS: 1.0000 mg | ORAL_TABLET | Freq: Every day | ORAL | Status: DC

## 2018-03-17 MED ORDER — TRAZODONE HCL 50 MG PO TABS: 50.0000 mg | ORAL_TABLET | Freq: Every evening | ORAL | Status: DC | PRN

## 2018-03-17 MED ORDER — SODIUM CHLORIDE 0.9 % IV SOLN
INTRAVENOUS | Status: DC
  Administered 2018-03-17 – 2018-03-18 (×2): via INTRAVENOUS

## 2018-03-17 MED ORDER — PERAMPANEL 4 MG PO TABS: 4.0000 mg | ORAL_TABLET | Freq: Every day | ORAL | Status: DC

## 2018-03-17 MED ORDER — THIAMINE HCL 100 MG/ML IJ SOLN: 100.0000 mg | Freq: Every day | INTRAMUSCULAR | Status: DC

## 2018-03-17 MED ORDER — VITAMIN B-1 100 MG PO TABS: 100.0000 mg | ORAL_TABLET | Freq: Every day | ORAL | Status: DC

## 2018-03-17 MED ORDER — ACETAMINOPHEN 325 MG PO TABS: 650.0000 mg | ORAL_TABLET | Freq: Four times a day (QID) | ORAL | Status: DC | PRN

## 2018-03-17 NOTE — Progress Notes (Signed)
EEG complete - results pending 

## 2018-03-17 NOTE — H&P (Signed)
Patient Demographics:    Melissa Barrett, is a 38 y.o. female  MRN: [id number]   DOB - 07-Feb-1980  Admit Date - 03/17/2018  Outpatient Primary MD for the patient is Tylene Fantasia., PA-C   Assessment & Plan:    Active Problems:   TOBACCO ABUSE   PERIPARTUM CARDIOMYOPATHY ANTPRTM COND/COMP   Seizure (HCC)   ETOH abuse    1) recurrent seizures/seizure disorder----patient received lorazepam and Keppra, discussed with patient's neurologist Dr. [name], who advised transition to IV Depakote, use IV lorazepam as needed seizures--- questions about alcohol being a trigger for patient's seizures as well as questions about noncompliance with medications  2)H/o peripartum cardiomyopathy/hypertension/dysrhythmia/history of CHF--- patient echo not available, patient sees Dr. Wyline Mood cardiologist, she takes Cardizem with continued use  3)FEN--- patient dehydrated, ketones and high specific gravity of urine noted with BMP showing anion gap of 18----hydrate IV n.p.o.,  4) tobacco and alcohol abuse----patient is not ready to quit, give nicotine patch, lorazepam per CIWA protocol, folic acid and thiamine as tolerated   With History of - Reviewed by me  Past Medical History:  Diagnosis Date  . Acute systolic heart failure (HCC)   . Hypertension   . Hypokalemia   . Pancreatitis   . Peripartum cardiomyopathy    with acute systolic CHF  . Tobacco abuse   . Tobacco use disorder       Past Surgical History:  Procedure Laterality Date  . CESAREAN SECTION    . DILATION AND CURETTAGE OF UTERUS    . TUBAL LIGATION        CC--- seizures   HPI:    Melissa Barrett  is a 38 y.o. female past medical history relevant for seizure disorder beginning Oct 2018, alcohol use, as well as history of CHF/peripartum  cardiomyopathy/hypertension who presented to [facility] ED with report of seizure with tongue biting .  Patient is waking up some she is a poor historian  Most of the history obtained from patient's dad and stepmom at bedside   Records from [facility] reviewed---  UA with high specific gravity and ketones,   BMP with potassium of 3.8 sodium was 133, creatinine 0.8 glucose is 110 calcium is 9.8 with anion gap of 18  CBC unremarkable with white count of 9.2 H&H of 15.0 and 40.5 and platelets of 215 EKG sinus rhythm  On exam Evidence of tongue biting on exam  Patient admits to frequent alcohol use  Patient insists she has been compliant with antiseizure medications  She did receive lorazepam and IV Keppra  I contacted patient's neurologist who advised switching patient to IV Depakote       Review of systems:    In addition to the HPI above,   A full Review of  Systems was done, all other systems reviewed are negative except as noted above in HPI , .    Social History:  Reviewed by me  Social History   Tobacco Use  . Smoking status: Current Every Day Smoker    Packs/day: 0.25    Types: Cigarettes    Start date: 09/25/1998  . Smokeless tobacco: Never Used  Substance Use Topics  . Alcohol use: Yes    Comment: occ       Family History :  Reviewed by me    Family History  Problem Relation Age of Onset  . Hypertension Father   . Cancer Maternal Grandmother   . Cancer Paternal Grandmother      Home Medications:   Prior to Admission medications   Medication Sig Start Date End Date Taking? Authorizing Provider  APTIOM 800 MG TABS Take 1 tablet by mouth daily. 01/11/18  Yes [provider]  diltiazem (TIAZAC) 180 MG 24 hr capsule TAKE ONE CAPSULE BY MOUTH DAILY. 01/11/18  Yes Branch, Dorothe Pea, MD  FYCOMPA 2 MG TABS Take 2 tablets by mouth at bedtime. 02/16/18  Yes [provider]  levETIRAcetam (KEPPRA) 500 MG tablet Take 1,000 mg  by mouth 2 (two) times daily.  11/19/16  Yes [provider]  ibuprofen (ADVIL,MOTRIN) 400 MG tablet Take 400 mg by mouth every 8 (eight) hours as needed.    [provider]  sertraline (ZOLOFT) 25 MG tablet Take 2 tablets by mouth daily. 02/17/18   [provider]     Allergies:     Allergies  Allergen Reactions  . Amoxicillin   . Penicillins      Physical Exam:   Vitals  Blood pressure 139/88, pulse 67, temperature (!) 97.2 F (36.2 C), temperature source Axillary, resp. rate 13, height  (1.626 m), weight 82.7 kg, SpO2 97 %.  Physical Examination: General appearance -waking up, somewhat sleepy still Mental status -somewhat sleepy able to answer questions  eyes - sclera anicteric Mouth--- evidence of tongue biting Neck - supple, no JVD elevation , Chest - clear  to auscultation bilaterally, symmetrical air movement,  Heart - S1 and S2 normal, regular  Abdomen - soft, nontender, nondistended, no masses or organomegaly Neurological - screening mental status exam normal, neck supple without rigidity, cranial nerves II through XII intact, DTR's normal and symmetric Extremities - no pedal edema noted, intact peripheral pulses  Skin - warm, dry     Data Review:    CBC No results for input(s): WBC, HGB, HCT, PLT, MCV, MCH, MCHC, RDW, LYMPHSABS, MONOABS, EOSABS, BASOSABS, BANDABS in the last 168 hours.  Invalid input(s): NEUTRABS, BANDSABD ------------------------------------------------------------------------------------------------------------------  Chemistries  No results for input(s): NA, K, CL, CO2, GLUCOSE, BUN, CREATININE, CALCIUM, MG, AST, ALT, ALKPHOS, BILITOT in the last 168 hours.  Invalid input(s): GFRCGP ------------------------------------------------------------------------------------------------------------------ CrCl cannot be calculated (Patient's most recent lab result is older than the maximum 21 days  allowed.). ------------------------------------------------------------------------------------------------------------------ No results for input(s): TSH, T4TOTAL, T3FREE, THYROIDAB in the last 72 hours.  Invalid input(s): FREET3   Coagulation profile No results for input(s): INR, PROTIME in the last 168 hours. ------------------------------------------------------------------------------------------------------------------- No results for input(s): DDIMER in the last 72 hours. -------------------------------------------------------------------------------------------------------------------  Cardiac Enzymes No results for input(s): CKMB, TROPONINI, MYOGLOBIN in the last 168 hours.  Invalid input(s): CK ------------------------------------------------------------------------------------------------------------------ No results found for: BNP   ---------------------------------------------------------------------------------------------------------------  Urinalysis    Component Value Date/Time   COLORURINE YELLOW 09/29/2008 2235   APPEARANCEUR CLEAR 09/29/2008 2235   LABSPEC 1.015 09/29/2008 2235   PHURINE 7.5 09/29/2008 2235   GLUCOSEU NEGATIVE 09/29/2008 2235   HGBUR NEGATIVE 09/29/2008 2235   BILIRUBINUR NEGATIVE 09/29/2008 2235  KETONESUR NEGATIVE 09/29/2008 2235   PROTEINUR 30 (A) 09/29/2008 2235   UROBILINOGEN 1.0 09/29/2008 2235   NITRITE NEGATIVE 09/29/2008 2235   LEUKOCYTESUR NEGATIVE 09/29/2008 2235    ----------------------------------------------------------------------------------------------------------------   Imaging Results:    No results found.  Radiological Exams on Admission: No results found.  DVT Prophylaxis -SCD   AM Labs Ordered, also please review Full Orders  Family Communication: Admission, patients condition and plan of care including tests being ordered have been discussed with the patient and dad and stepmom at bedside* who indicate  understanding and agree with the plan   Code Status - Full Code  Likely DC to  home  Condition   stable  Shon Hale M.D on 03/17/2018 at 6:01 PM Go to [email or web address] -  for contact info  [facility] - Office  [phone]

## 2018-03-17 NOTE — Procedures (Signed)
  [facility] Melissa Barrett A. Melissa Pilgrim, MD     [email or web address]           HISTORY: This is a 38 year old female who presents with multiple seizures/status epilepticus.  MEDICATIONS:  Current Facility-Administered Medications:  .  0.9 %  sodium chloride infusion, 250 mL, Intravenous, PRN, [name], [name], MD .  0.9 %  sodium chloride infusion, , Intravenous, Continuous, [name], [name], MD, Last Rate: 125 mL/hr at 03/17/18 1345 .  acetaminophen (TYLENOL) tablet 650 mg, 650 mg, Oral, Q6H PRN **OR** acetaminophen (TYLENOL) suppository 650 mg, 650 mg, Rectal, Q6H PRN, [name], [name], MD .  albuterol (PROVENTIL) (2.5 MG/3ML) 0.083% nebulizer solution 2.5 mg, 2.5 mg, Nebulization, Q2H PRN, [name], [name], MD .  diltiazem (CARDIZEM CD) 24 hr capsule 180 mg, 180 mg, Oral, Daily, [name], [name], MD, 180 mg at 03/17/18 1341 .  folic acid (FOLVITE) tablet 1 mg, 1 mg, Oral, Daily, [name], [name], MD, 1 mg at 03/17/18 1816 .  heparin injection 5,000 Units, 5,000 Units, Subcutaneous, Q8H, [name], [name], MD, 5,000 Units at 03/17/18 1350 .  LORazepam (ATIVAN) injection 1 mg, 1 mg, Intravenous, Q4H PRN, [name], [name], MD .  LORazepam (ATIVAN) tablet 1 mg, 1 mg, Oral, Q6H PRN **OR** LORazepam (ATIVAN) injection 1 mg, 1 mg, Intravenous, Q6H PRN, [name], [name], MD .  multivitamin with minerals tablet 1 tablet, 1 tablet, Oral, Daily, [name], [name], MD, 1 tablet at 03/17/18 1817 .  nicotine (NICODERM CQ - dosed in mg/24 hours) patch 14 mg, 14 mg, Transdermal, Daily, [name], [name], MD, 14 mg at 03/17/18 1817 .  ondansetron (ZOFRAN) tablet 4 mg, 4 mg, Oral, Q6H PRN **OR** ondansetron (ZOFRAN) injection 4 mg, 4 mg, Intravenous, Q6H PRN, [name], [name], MD .  polyethylene glycol (MIRALAX / GLYCOLAX) packet 17 g, 17 g, Oral, Daily PRN, [name], [name], MD .  sodium chloride flush (NS) 0.9 % injection 3 mL, 3 mL, Intravenous, Q12H, [name], [name], MD, 3 mL at 03/17/18 1350 .   sodium chloride flush (NS) 0.9 % injection 3 mL, 3 mL, Intravenous, PRN, [name], [name], MD .  thiamine (VITAMIN B-1) tablet 100 mg, 100 mg, Oral, Daily, 100 mg at 03/17/18 1817 **OR** thiamine (B-1) injection 100 mg, 100 mg, Intravenous, Daily, [name], [name], MD .  traZODone (DESYREL) tablet 50 mg, 50 mg, Oral, QHS PRN, [name], [name], MD .  valproate (DEPACON) 500 mg in dextrose 5 % 50 mL IVPB, 500 mg, Intravenous, Q8H, Trishia Cuthrell, MD, Last Rate: 55 mL/hr at 03/17/18 1511, 500 mg at 03/17/18 1511     ANALYSIS: A 16 channel recording using standard 10 20 measurements is conducted for 24 minutes.  There is a low voltage electrocortical background activity of 20 Hz which attenuates somewhat with eye opening.  There is beta activity observed in frontal areas.  Awake and drowsy activities are observed.  There is increased spindling activity seen throughout the recording.  There is rudimentary K complexes also observed.  Photic stimulation and hyperventilation are not conducted.  There is no focal or lateralized slowing.  There is no epileptiform activity is observed.   IMPRESSION: 1.  This recording of the awake and drowsy state shows increased spindles likely due to medication effect.  However, there is no epileptiform activity is observed.      Melissa Barrett A. Melissa Barrett, M.D.  Diplomate, Biomedical engineer of Psychiatry and Neurology ( Neurology).

## 2018-03-17 NOTE — Consult Note (Signed)
[facility] Batoul Limes A. [name], MD     [email or web address]          Melissa Barrett is an 38 y.o. female.   ASSESSMENT/PLAN: 1.  Resolved status epilepticus.  The patient will be maintained on IV Depacon/valproic acid IV.  I will check a level tomorrow.  We will maintain her baseline antiseizure medication with Fycompa and Aptiom.  The patient will be allowed to take her medications from home as they are nonformulary here at the hospital.  We will continue to follow patient.  She can be dosed with IV Ativan for breakthrough seizures.  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  This is currently being arranged in the office.    The patient is a 38 year old white female who presents with multiple seizures today.  She woke up with these events.  Most of her seizures occur while she is sleeping.  In fact I believe they all occur while she is sleeping but they have been resistant to treatment.  She was initially diagnosed about a year and a half ago.  Her seizures have been very difficult to treat.  She has been on Keppra previously was seems to work Child psychotherapist but she has had a lot of interval side effects even with the extended release release formulation.  She currently has been maintained on Aptiom which was started just recently and Fycompa which was even started more recently.  She has tolerated these medications fairly well though she has had some issues with mild dizziness and drowsiness.  The patient was seen at [facility] and after having multiple seizures she was given few doses of IV Ativan.  Under my instructions, she was loaded with IV Depacon 3 mg/kg and sent over to this hospital for furthe management.  She was given a dose of Keppra in ICU at this hospital.  She has remained seizure-free although she has been quite drowsy.  She reports  having biting her tongue with the current seizures and with no seizures.  She does report having urinary incontinence.  She denies bowel incontinence.  Review of systems is limited given the drowsiness.  GENERAL: She is quite drowsy but responsive.  HEENT: There is evidence of tongue bite injury involving both sides of the tongue.  Neck is supple.  ABDOMEN: soft  EXTREMITIES: No edema   BACK: Normal  SKIN: Normal by inspection.    MENTAL STATUS: She lays in bed with eyes closed but opens her eyes to light sternal rub.  She does follow commands and with prompting becomes conversational.  Speech is normal.  CRANIAL NERVES: Pupils are equal, round and reactive to light and accomodation; extra ocular movements are full, there is no significant nystagmus; visual fields are full; upper and lower facial muscles are normal in strength and symmetric, there is no flattening of the nasolabial folds; tongue is midline; uvula is midline; shoulder elevation is normal.  MOTOR: Normal tone, bulk and strength; no pronator drift.  COORDINATION: Left finger to nose is normal, right finger to nose is normal, No rest tremor; no intention tremor; no postural tremor; no bradykinesia.  REFLEXES: Deep tendon reflexes are symmetrical and normal.   SENSATION: Normal to light touch, temperature, and pain.         PRIOR NOTE 1.  Resolved status epilepticus: The patient is suspected of having epileptic seizures.  She will be  restarted on Keppra.  She was given a loading dose yesterday about 24 hours ago.  This will be redone and the maintenance dose of 500 mg twice a day will also be restarted.  Will continue with her baseline aptiom.  Both these medications are in the low dose range and should be escalated to efficacy.  This should be done in outpatient setting.  There has been concerned about drowsiness but I think we should first adjust the more urgent problem which is seizure control.  Repeat EEG will be  obtained.  She is a precaution is recommended.  Alcohol cessation is also recommended although the patient indicates that she does not consume much alcohol.    2.  Alcohol use questionable alcohol withdrawal seizures: At this time this is not well established and therefore the patient should be treated if she has epileptic seizures.     Patient is a 38 year old black female who has a one-year history of seizures.  She reports that she has had approximately 6 seizures in the last year.  The semiology of her seizures seem to be stereotyped.  She reports having an abnormal burning smell followed by hot-like sensation over her body when she loses consciousness.  She is reported to have generalized tonic-clonic activity.  Seizures almost also always associated with tongue biting.  She is amnestic to the event.  There is no significant history of head trauma.  She seemed to have significant short-term memory.  This limits the history.  There is no history of prematurity or CNS infections.  Her uncle had a history of seizures and unfortunately died from complications of this.  There is questioned about alcohol use although she categorically denies drinking daily and reports that when she drinks only about 2 beers.  There has been questions about patient drinking more but family is not around to corroborate this.  The review of systems otherwise negative.     Blood pressure 139/88, pulse 65, temperature 98.3 F (36.8 C), temperature source Oral, resp. rate 17, height 5\' 4"  (1.626 m), weight 82.7 kg, SpO2 100 %.  Past Medical History:  Diagnosis Date  . Acute systolic heart failure (HCC)   . Hypertension   . Hypokalemia   . Pancreatitis   . Peripartum cardiomyopathy    with acute systolic CHF  . Tobacco abuse   . Tobacco use disorder     Past Surgical History:  Procedure Laterality Date  . CESAREAN SECTION    . DILATION AND CURETTAGE OF UTERUS    . TUBAL LIGATION      Family History   Problem Relation Age of Onset  . Hypertension Father   . Cancer Maternal Grandmother   . Cancer Paternal Grandmother     Social History:  reports that she has been smoking cigarettes. She started smoking about 19 years ago. She has been smoking about 0.25 packs per day. She has never used smokeless tobacco. She reports current alcohol use. She reports that she does not use drugs.  Allergies:  Allergies  Allergen Reactions  . Amoxicillin   . Penicillins     Medications: Prior to Admission medications   Medication Sig Start Date End Date Taking? Authorizing Provider  APTIOM 800 MG TABS Take 1 tablet by mouth daily. 01/11/18  Yes [provider]  diltiazem (TIAZAC) 180 MG 24 hr capsule TAKE ONE CAPSULE BY MOUTH DAILY. 01/11/18  Yes Branch, Dorothe PeaJonathan F, MD  FYCOMPA 2 MG TABS Take 2 tablets by mouth at bedtime.  02/16/18  Yes [provider]  levETIRAcetam (KEPPRA) 500 MG tablet Take 1,000 mg by mouth 2 (two) times daily.  11/19/16  Yes [provider]  ibuprofen (ADVIL,MOTRIN) 400 MG tablet Take 400 mg by mouth every 8 (eight) hours as needed.    [provider]  sertraline (ZOLOFT) 25 MG tablet Take 2 tablets by mouth daily. 02/17/18   [provider]    Scheduled Meds: . diltiazem  180 mg Oral Daily  . heparin  5,000 Units Subcutaneous Q8H  . levETIRAcetam  1,000 mg Oral QHS  . sodium chloride flush  3 mL Intravenous Q12H   Continuous Infusions: . sodium chloride    . sodium chloride 125 mL/hr at 03/17/18 1345  . valproate sodium     PRN Meds:.sodium chloride, acetaminophen **OR** acetaminophen, albuterol, LORazepam, ondansetron **OR** ondansetron (ZOFRAN) IV, polyethylene glycol, sodium chloride flush, traZODone     No results found for this or any previous visit (from the past 48 hour(s)).  Studies/Results:  BRAIN MRI 10-2017  FINDINGS: There is varying motion artifact throughout the examination with some sequences being  severely degraded.  Brain: Diffusion imaging is of diagnostic quality, and there is no evidence of acute infarct. No intracranial hemorrhage, mass, midline shift, or extra-axial fluid collection is identified. The ventricles and sulci are normal. No significant white matter disease is evident. Dedicated coronal oblique imaging through the temporal lobes is nondiagnostic due to severe motion artifact.  Vascular: Major intracranial vascular flow voids are preserved.  Skull and upper cervical spine: No suspicious marrow lesion.  Sinuses/Orbits: Circumferential right maxillary sinus mucosal thickening. Minimal right ethmoid air cell mucosal thickening. No significant mastoid fluid.  Other: None.  IMPRESSION: Motion degraded examination without acute intracranial abnormality or gross cause of seizures identified.    Melissa Barrett, M.D.  Diplomate, Biomedical engineer of Psychiatry and Neurology ( Neurology). 03/17/2018, 2:32 PM

## 2018-03-18 ENCOUNTER — Encounter (HOSPITAL_COMMUNITY): Payer: Self-pay | Admitting: *Deleted

## 2018-03-18 DIAGNOSIS — R569 Unspecified convulsions: Secondary | ICD-10-CM | POA: Diagnosis not present

## 2018-03-18 DIAGNOSIS — G40909 Epilepsy, unspecified, not intractable, without status epilepticus: Secondary | ICD-10-CM | POA: Diagnosis not present

## 2018-03-18 LAB — HIV ANTIBODY (ROUTINE TESTING W REFLEX): HIV Screen 4th Generation wRfx: NONREACTIVE

## 2018-03-18 LAB — VALPROIC ACID LEVEL: VALPROIC ACID LVL: 58 ug/mL (ref 50.0–100.0)

## 2018-03-18 MED ORDER — NICOTINE 14 MG/24HR TD PT24: 14.0000 mg | MEDICATED_PATCH | Freq: Every day | TRANSDERMAL | 0 refills | Status: DC

## 2018-03-18 MED ORDER — THIAMINE HCL 100 MG PO TABS: 100.0000 mg | ORAL_TABLET | Freq: Every day | ORAL | 2 refills | Status: DC

## 2018-03-18 MED ORDER — DIVALPROEX SODIUM 250 MG PO DR TAB
500.0000 mg | DELAYED_RELEASE_TABLET | Freq: Once | ORAL | Status: AC
  Administered 2018-03-18: 500 mg via ORAL
  Filled 2018-03-18: qty 2

## 2018-03-18 MED ORDER — APTIOM 800 MG PO TABS: 1.0000 | ORAL_TABLET | Freq: Every day | ORAL | 2 refills | Status: DC

## 2018-03-18 MED ORDER — FYCOMPA 2 MG PO TABS: 2.0000 | ORAL_TABLET | Freq: Every day | ORAL | 2 refills | Status: DC

## 2018-03-18 MED ORDER — ACETAMINOPHEN 325 MG PO TABS: 650.0000 mg | ORAL_TABLET | Freq: Four times a day (QID) | ORAL | 0 refills | Status: DC | PRN

## 2018-03-18 MED ORDER — FOLIC ACID 1 MG PO TABS: 1.0000 mg | ORAL_TABLET | Freq: Every day | ORAL | 2 refills | Status: DC

## 2018-03-18 MED ORDER — DIVALPROEX SODIUM 500 MG PO DR TAB: 500.0000 mg | DELAYED_RELEASE_TABLET | Freq: Two times a day (BID) | ORAL | 3 refills | Status: AC

## 2018-03-18 NOTE — Discharge Instructions (Signed)
1)Complete Abstinence from alcohol advised 2)Take your antiseizure medications as advised 3) Follow-up with the neurologist-- Dr. Beryle Beams in 2 weeks for reevaluation and medication adjustments 4) Your anti-seizure medications prescriptions including Depakote,  Aptiom and Fycompa have been sent to [facility]--- please pick them up today 03/18/2018 5) smoking cessation strongly advised  6)Per [facility] statutes, patients with seizures are not allowed to drive until they have been seizure-free for six months.  Use caution when using heavy equipment or power tools. Avoid working on ladders or at heights. Take showers instead of baths, Do not lock yourself in a room alone (i.e. bathroom).Ensure the water temperature is not too high on the home water heater. Do not go swimming alone. When caring for infants or small children, sit down when holding, feeding, or changing them to minimize risk of injury to the child in the event you have a seizure.   Do not lock yourself in a room alone (i.e. bathroom).  Maintain good sleep hygiene. Avoid alcohol.   If patienthas another seizure, call 911 and bring them back to the ED if: A. The seizure lasts longer than 5 minutes.  B. The patient doesn't wake shortly after the seizure or has new problems such as difficulty seeing, speaking or moving following the seizure C. The patient was injured during the seizure D. The patient has a temperature over 102 F (39C) E. The patient vomited during the seizure and now is having trouble breathing

## 2018-03-18 NOTE — Discharge Summary (Signed)
Melissa Barrett, is a 38 y.o. female  DOB Jun 03, 1980  MRN [id number].  Admission date:  03/17/2018  Admitting Physician  Vassie Loll, MD  Discharge Date:  03/18/2018   Primary MD  Tylene Fantasia., PA-C  Recommendations for primary care physician for things to follow:  1)Complete Abstinence from alcohol advised 2)Take your antiseizure medications as advised 3) Follow-up with the neurologist-- Dr. Beryle Beams in 2 weeks for reevaluation and medication adjustments 4) Your anti-seizure medications prescriptions including Depakote,  Aptiom and Fycompa have been sent to [facility]--- please pick them up today 03/18/2018 5) smoking cessation strongly advised  6)Per [facility] statutes, patients with seizures are not allowed to drive until they have been seizure-free for six months.  Use caution when using heavy equipment or power tools. Avoid working on ladders or at heights. Take showers instead of baths, Do not lock yourself in a room alone (i.e. bathroom).Ensure the water temperature is not too high on the home water heater. Do not go swimming alone. When caring for infants or small children, sit down when holding, feeding, or changing them to minimize risk of injury to the child in the event you have a seizure.   Do not lock yourself in a room alone (i.e. bathroom).  Maintain good sleep hygiene. Avoid alcohol.   If patienthas another seizure, call 911 and bring them back to the ED if: A. The seizure lasts longer than 5 minutes.  B. The patient doesn't wake shortly after the seizure or has new problems such as difficulty seeing, speaking or moving following the seizure C. The patient was injured during the seizure D. The patient has a temperature over 102 F (39C) E. The patient vomited during the seizure and now is having trouble breathing    Admission Diagnosis   seizures   Discharge Diagnosis  seizures    Principal Problem:   Seizure (HCC) Active Problems:   TOBACCO ABUSE   PERIPARTUM CARDIOMYOPATHY ANTPRTM COND/COMP   ETOH abuse      Past Medical History:  Diagnosis Date  . Acute systolic heart failure (HCC)   . Hypertension   . Hypokalemia   . Pancreatitis   . Peripartum cardiomyopathy    with acute systolic CHF  . Tobacco abuse   . Tobacco use disorder     Past Surgical History:  Procedure Laterality Date  . CESAREAN SECTION    . DILATION AND CURETTAGE OF UTERUS    . TUBAL LIGATION         HPI  from the history and physical done on the day of admission:    Melissa Barrett  is a 38 y.o. female past medical history relevant for seizure disorder beginning Oct 2018, alcohol use, as well as history of CHF/peripartum cardiomyopathy/hypertension who presented to [facility] ED with report of seizure with tongue biting .  Patient is waking up some she is a poor historian  Most of the history obtained from patient's dad and stepmom at bedside   Records from  [facility] reviewed---  UA with high specific gravity and ketones,   BMP with potassium of 3.8 sodium was 133, creatinine 0.8 glucose is 110 calcium is 9.8 with anion gap of 18  CBC unremarkable with white count of 9.2 H&H of 15.0 and 40.5 and platelets of 215 EKG sinus rhythm  On exam Evidence of tongue biting on exam  Patient admits to frequent alcohol use  Patient insists she has been compliant with antiseizure medications  She did receive lorazepam and IV Keppra  I contacted patient's neurologist who advised switching patient to IV Depakote     Hospital Course:      1)Recurrent seizures/seizure disorder----patient received lorazepam and Keppra, discussed with patient's neurologist Dr. [name], who advised transition to IV Depakote--- questions about alcohol being a trigger for patient's seizures as well as questions about noncompliance with  medications----no further seizures here at this hospital during her stay , EEG without epileptiform findings, serum Depakote level up to 58, per neurologist okay to discharge home on from Fycompa, Aptiom and Depakote  2)H/o peripartum cardiomyopathy/hypertension/dysrhythmia/history of CHF---  no recent echo not available, patient sees Dr. Wyline Mood cardiologist, she takes Cardizem   3)FEN---  admitted with dehydration, elevated anion gap and high specific gravity urine, improved with oral and IV hydration  4) tobacco and alcohol abuse----patient is not ready to quit, continue nicotine patch, continue folic acid and thiamine    Discharge Condition: stable (stepmom is driving)  Follow UP--- Neuro in 2 weeks   Diet and Activity recommendation:  As advised  Discharge Instructions    Discharge Instructions    Call MD for:  difficulty breathing, headache or visual disturbances   Complete by:  As directed    Call MD for:  persistant dizziness or light-headedness   Complete by:  As directed    Call MD for:  persistant nausea and vomiting   Complete by:  As directed    Call MD for:  severe uncontrolled pain   Complete by:  As directed    Call MD for:  temperature >100.4   Complete by:  As directed    Diet - low sodium heart healthy   Complete by:  As directed    Discharge instructions   Complete by:  As directed    1)Complete Abstinence from alcohol advised 2)Take your antiseizure medications as advised 3) Follow-up with the neurologist-- Dr. Beryle Beams in 2 weeks for reevaluation and medication adjustments 4) Your anti-seizure medications prescriptions including Depakote,  Aptiom and Fycompa have been sent to [facility]--- please pick them up today 03/18/2018 5) smoking cessation strongly advised  6)Per [facility] statutes, patients with seizures are not allowed to drive until they have been seizure-free for six months.  Use caution when using heavy equipment or power  tools. Avoid working on ladders or at heights. Take showers instead of baths, Do not lock yourself in a room alone (i.e. bathroom).Ensure the water temperature is not too high on the home water heater. Do not go swimming alone. When caring for infants or small children, sit down when holding, feeding, or changing them to minimize risk of injury to the child in the event you have a seizure.   Do not lock yourself in a room alone (i.e. bathroom).  Maintain good sleep hygiene. Avoid alcohol.   If patienthas another seizure, call 911 and bring them back to the ED if: A. The seizure lasts longer than 5 minutes.  B. The patient doesn't wake shortly  after the seizure or has new problems such as difficulty seeing, speaking or moving following the seizure C. The patient was injured during the seizure D. The patient has a temperature over 102 F (39C) E. The patient vomited during the seizure and now is having trouble breathing   Increase activity slowly   Complete by:  As directed    See seizure  related limitations as outlined        Discharge Medications     Allergies as of 03/18/2018      Reactions   Amoxicillin    Penicillins       Medication List    STOP taking these medications   levETIRAcetam 500 MG tablet Commonly known as:  KEPPRA     TAKE these medications   acetaminophen 325 MG tablet Commonly known as:  TYLENOL Take 2 tablets (650 mg total) by mouth every 6 (six) hours as needed for mild pain (or Fever >/= 101).   APTIOM 800 MG Tabs Generic drug:  Eslicarbazepine Acetate Take 1 tablet by mouth daily.   diltiazem 180 MG 24 hr capsule Commonly known as:  TIAZAC TAKE ONE CAPSULE BY MOUTH DAILY.   divalproex 500 MG DR tablet Commonly known as:  Depakote Take 1 tablet (500 mg total) by mouth 2 (two) times daily.   folic acid 1 MG tablet Commonly known as:  FOLVITE Take 1 tablet (1 mg total) by mouth daily. Start taking on:  March 19, 2018   FYCOMPA 2 MG  Tabs Generic drug:  Perampanel Take 2 tablets (4 mg total) by mouth at bedtime. What changed:  how much to take   ibuprofen 400 MG tablet Commonly known as:  ADVIL,MOTRIN Take 400 mg by mouth every 8 (eight) hours as needed.   nicotine 14 mg/24hr patch Commonly known as:  NICODERM CQ - dosed in mg/24 hours Place 1 patch (14 mg total) onto the skin daily. Start taking on:  March 19, 2018   sertraline 25 MG tablet Commonly known as:  ZOLOFT Take 2 tablets by mouth daily.   thiamine 100 MG tablet Take 1 tablet (100 mg total) by mouth daily. Start taking on:  March 19, 2018      Major procedures and Radiology Reports - PLEASE review detailed and final reports for all details, in brief -    Recent Results (from the past 240 hour(s))  MRSA PCR Screening     Status: None   Collection Time: 03/17/18 12:49 PM  Result Value Ref Range Status   MRSA by PCR NEGATIVE NEGATIVE Final    Comment:        The GeneXpert MRSA Assay (FDA approved for NASAL specimens only), is one component of a comprehensive MRSA colonization surveillance program. It is not intended to diagnose MRSA infection nor to guide or monitor treatment for MRSA infections. Performed at [facility], [address], [place], [place] [address]     Today   Subjective    Kamorie Aldous today has no new concerns, no further seizures, eating and drinking well, family at bedside, questions answered... Patient is requesting discharge home... Neurologist okay with discharge home          Patient has been seen and examined prior to discharge   Objective   Blood pressure 113/82, pulse 70, temperature 98.9 F (37.2 C), temperature source Oral, resp. rate 15, height  (1.626 m), weight 84.6 kg, SpO2 99 %.   Intake/Output Summary (Last 24 hours) at 03/18/2018 1524 Last  data filed at 03/18/2018 1446 Gross per 24 hour  Intake 1218 ml  Output 300 ml  Net 918 ml    Exam Gen:- Awake Alert, no acute distress  HEENT:-  [place].AT, No sclera icterus Neck-Supple Neck,No JVD,.  Lungs-  CTAB , good air movement bilaterally  CV- S1, S2 normal, regular Abd-  +ve B.Sounds, Abd Soft, No tenderness,    Extremity/Skin:- No  edema,   good pulses Psych-affect is appropriate, oriented x3 Neuro-no new focal deficits, no tremors    Data Review   CBC w Diff:  Lab Results  Component Value Date   WBC 8.8 11/28/2015   HGB 14.0 11/28/2015   HCT 37.8 11/28/2015   PLT 198 11/28/2015   CMP:  Lab Results  Component Value Date   NA 138 11/28/2015   K 3.2 (L) 11/28/2015   CL 105 11/28/2015   CO2 27 11/28/2015   BUN <5 (L) 11/28/2015   CREATININE 0.80 11/28/2015   Total Discharge time is about 33 minutes  Shon Hale M.D on 03/18/2018 at 3:24 PM  Go to [email or web address] -  for contact info  [facility] - Office  [phone]

## 2018-03-18 NOTE — Progress Notes (Signed)
Family in room, requested again if family has or will be bringing pt's seizure medications. Pt stated, "I have my medications at home and will take them when I get discharged." stressed importance of taking medications as scheduled and that we need to check more labs before she is discharged. Pt insist on taking meds once she is home.

## 2018-03-19 ENCOUNTER — Encounter (HOSPITAL_COMMUNITY): Payer: Self-pay | Admitting: *Deleted

## 2018-04-16 ENCOUNTER — Telehealth: Payer: Self-pay | Admitting: *Deleted

## 2018-04-16 NOTE — Telephone Encounter (Signed)
Multiple attempts to reach pt regarding 4/7 appt with Dr Wyline Mood. Canceled appt with current pandemic and will have in office staff mail letter. I have placed recall

## 2018-04-20 ENCOUNTER — Ambulatory Visit: Payer: Medicaid Other | Admitting: Cardiology

## 2018-04-28 IMAGING — DX DG CHEST 2V
2 series · 2 of 2 positions shown · non-contrast
Comparison: Chest CT 11/27/2015

CLINICAL DATA: Left-sided chest pain

EXAM:
CHEST  2 VIEW

[chest pa]
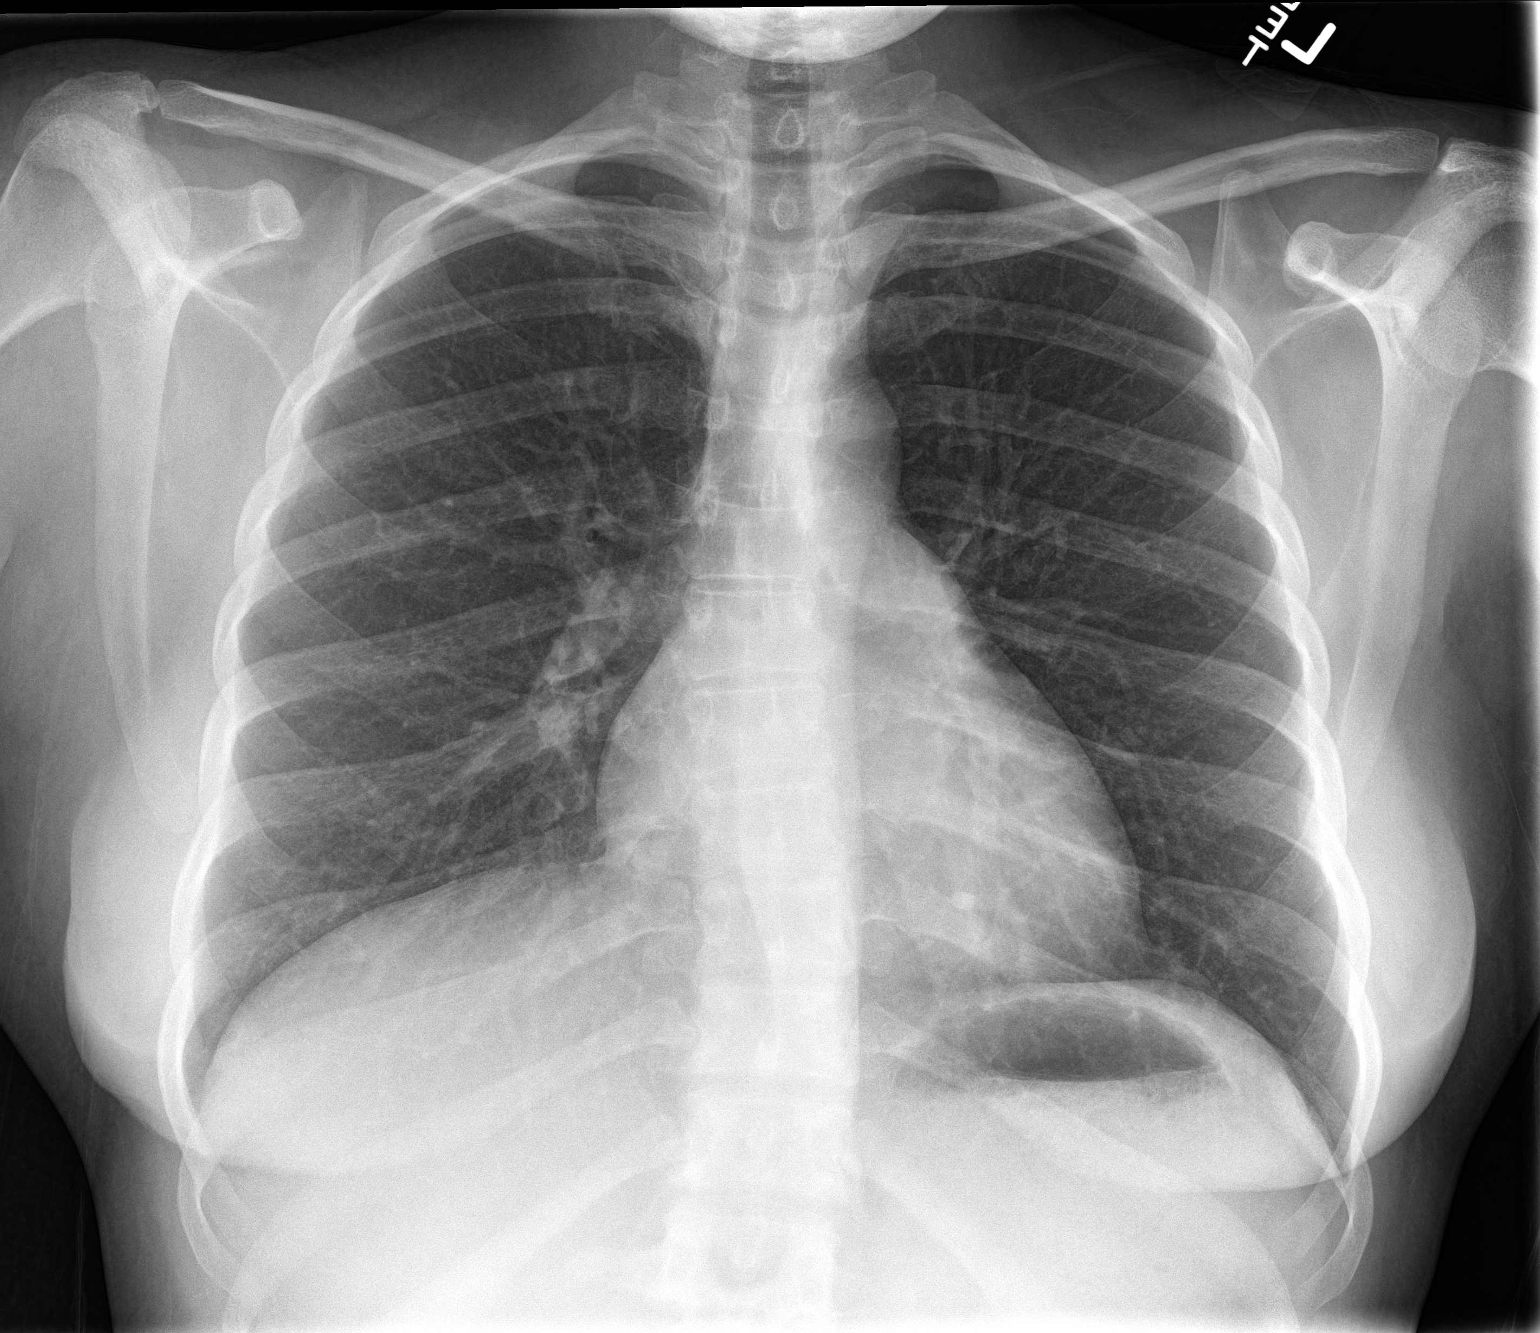

[chest lat]
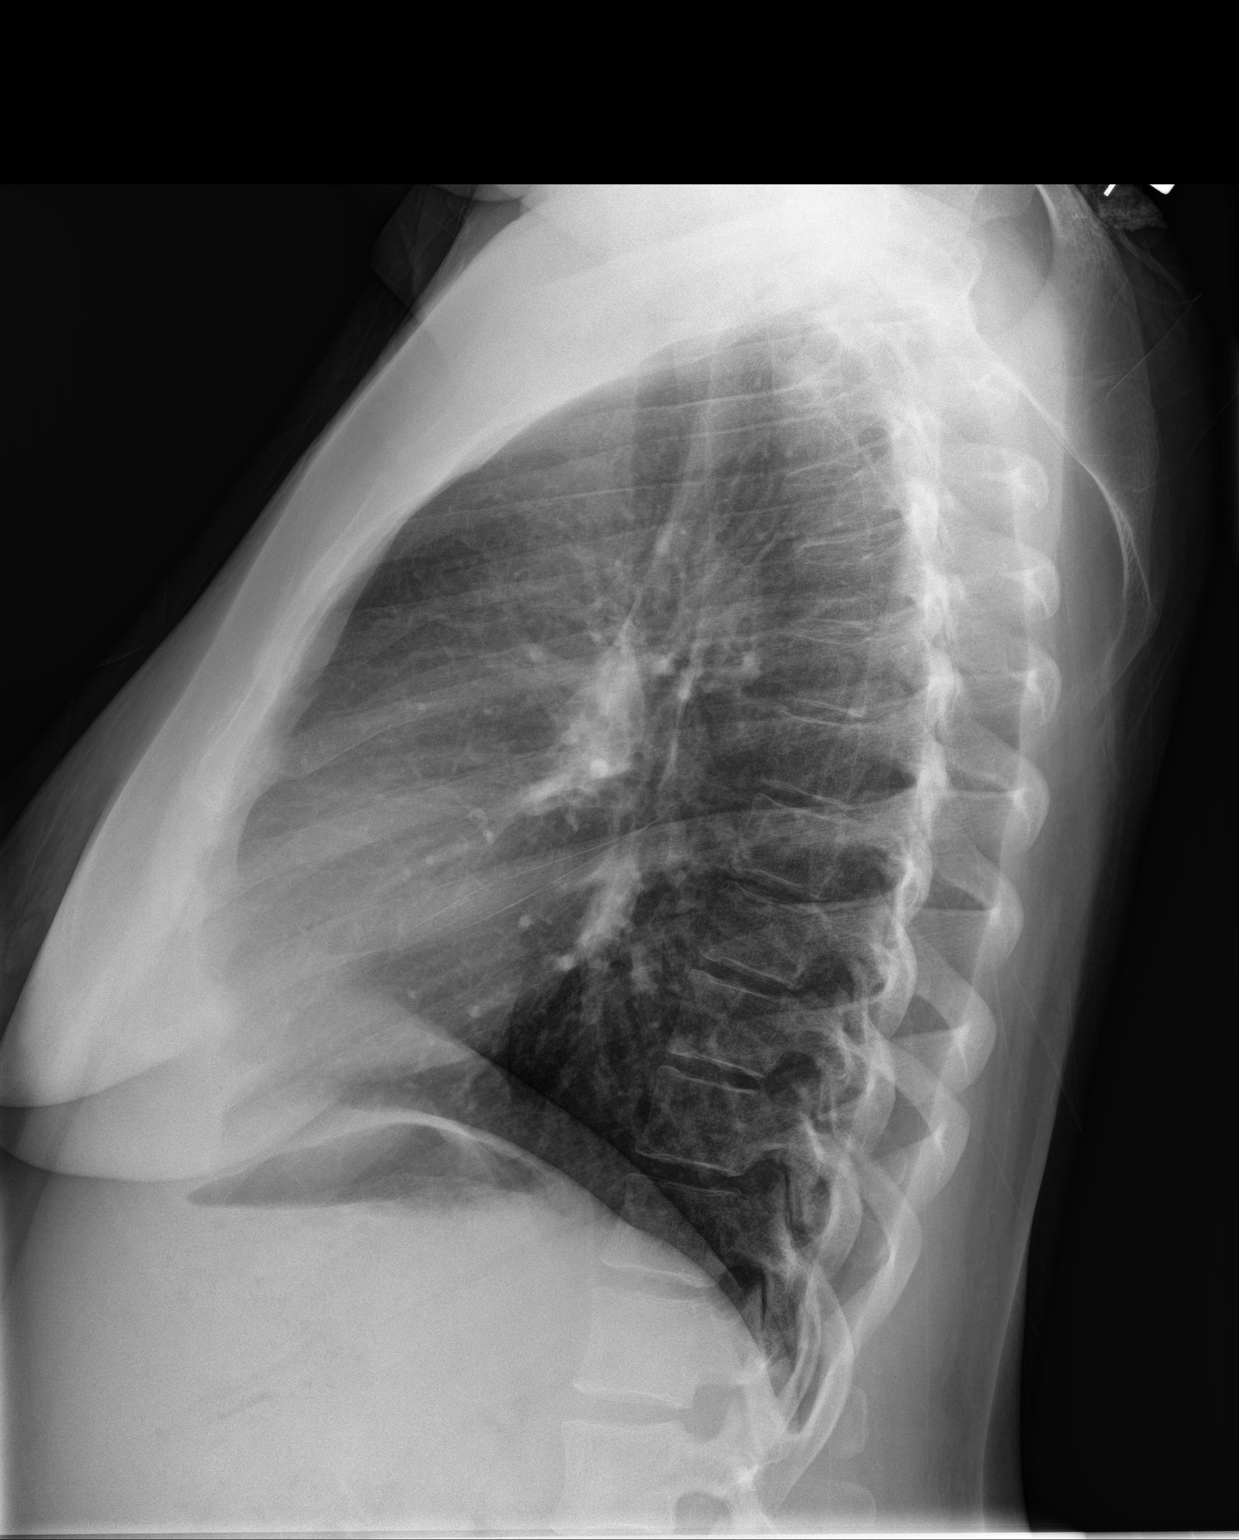

[2 of 2 positions shown; findings below may reference images not displayed]

FINDINGS: Cardiomediastinal contours are normal. No pneumothorax or pleural
effusion.

No focal airspace consolidation or pulmonary edema.
IMPRESSION: Clear lungs.

## 2018-07-12 ENCOUNTER — Ambulatory Visit: Payer: Medicaid Other | Admitting: Cardiology

## 2018-10-28 ENCOUNTER — Ambulatory Visit: Payer: Medicaid Other | Admitting: Cardiology

## 2018-12-03 ENCOUNTER — Other Ambulatory Visit: Payer: Self-pay

## 2018-12-03 ENCOUNTER — Encounter: Payer: Self-pay | Admitting: Cardiology

## 2018-12-03 ENCOUNTER — Ambulatory Visit (INDEPENDENT_AMBULATORY_CARE_PROVIDER_SITE_OTHER): Payer: Medicaid Other | Admitting: Cardiology

## 2018-12-03 VITALS — BP 121/79 | HR 73 | Ht 64.0 in | Wt 206.0 lb

## 2018-12-03 DIAGNOSIS — I10 Essential (primary) hypertension: Secondary | ICD-10-CM

## 2018-12-03 DIAGNOSIS — R002 Palpitations: Secondary | ICD-10-CM

## 2018-12-03 DIAGNOSIS — O903 Peripartum cardiomyopathy: Secondary | ICD-10-CM | POA: Diagnosis not present

## 2018-12-03 DIAGNOSIS — R569 Unspecified convulsions: Secondary | ICD-10-CM | POA: Diagnosis not present

## 2018-12-03 NOTE — Patient Instructions (Signed)

## 2018-12-03 NOTE — Progress Notes (Addendum)
Cardiology Office Note  Date: 12/03/2018   ID: Melissa Barrett, DOB 01/11/1981, MRN [id number]  PCP:  System, Provider Not In  Cardiologist:  Carlyle Dolly, MD Electrophysiologist:  None   Chief Complaint  Patient presents with  . Follow-up    Hypertension, palpitations, status post peripartum cardiomyopathy    History of Present Illness: Melissa Barrett is a 38 y.o. female with history of peripartum cardiomyopathy, hypertension, history of seizure disorder.  Last office encounter was in April 2019.  Patient had previous episode of palpitations was found to be in SVT.  She apparently had allergic reaction to shrimp.  She was converted to normal sinus rhythm with IV diltiazem.  No further palpitations.  Patient also had suspected PE secondary to elevated D-dimer.  CT per PE protocol was negative.  Patient was started on Lopressor which was eventually reduced from 25 mg down to 12.5 mg p.o. twice daily for complaints of dizziness.  Diltiazem was increased to 180 mg daily   History of peripartum cardiomyopathy.  Follow-up echocardiogram subsequently showed increase of left ventricular ejection fraction of 50 to 55%  Patient has a history of smoking and substance use/dependence in the form of alcohol.  History of hypertension: Blood pressure at the last visit was within normal limits.  Past Medical History:  Diagnosis Date  . Acute systolic heart failure ([place])   . Alcohol abuse   . HTN (hypertension)   . Hypertension   . Hypokalemia   . Pancreatitis   . Peripartum cardiomyopathy    with acute systolic CHF  . Seizures ([place])   . Tobacco abuse   . Tobacco use disorder     Past Surgical History:  Procedure Laterality Date  . CESAREAN SECTION    . DILATION AND CURETTAGE OF UTERUS    . TUBAL LIGATION      Current Outpatient Medications  Medication Sig Dispense Refill  . diltiazem (TIAZAC) 180 MG 24 hr capsule TAKE ONE CAPSULE BY MOUTH DAILY. 90 capsule 3  . divalproex  (DEPAKOTE) 500 MG DR tablet Take 1 tablet (500 mg total) by mouth 2 (two) times daily. 60 tablet 3  . Eslicarbazepine Acetate (APTIOM) 600 MG TABS Take 600 mg by mouth daily. For seizures    . hydrOXYzine (VISTARIL) 25 MG capsule Take 25-50 mg by mouth every 8 (eight) hours as needed (for sleep).     Marland Kitchen ibuprofen (ADVIL,MOTRIN) 800 MG tablet Take 800 mg by mouth every 8 (eight) hours as needed for mild pain or moderate pain.    . perampanel (FYCOMPA) 2 MG tablet Take 6 mg by mouth every morning.    . thiamine 100 MG tablet Take 1 tablet (100 mg total) by mouth daily. 30 tablet 2   No current facility-administered medications for this visit.    Allergies:  Amoxicillin, Amoxicillin, Penicillins, and Penicillins   Social History: The patient  reports that she has been smoking cigarettes. She started smoking about 20 years ago. She has been smoking about 0.50 packs per day. She has never used smokeless tobacco. She reports current alcohol use. She reports that she does not use drugs.   Family History: The patient's family history includes Cancer in her maternal grandmother and paternal grandmother; Hypertension in her father.   ROS:  Please see the history of present illness. Otherwise, complete review of systems is positive for none.  All other systems are reviewed and negative.   Physical Exam: VS:  BP 121/79   Pulse 73  Ht 5\' 4"  (1.626 m)   Wt 206 lb (93.4 kg)   SpO2 98%   BMI 35.36 kg/m , BMI Body mass index is 35.36 kg/m.  Wt Readings from Last 3 Encounters:  12/03/18 206 lb (93.4 kg)  03/18/18 186 lb 8.2 oz (84.6 kg)  11/03/17 198 lb (89.8 kg)    General: Patient appears comfortable at rest. Neck: Supple, no elevated JVP or carotid bruits, no thyromegaly. Lungs: Clear to auscultation, nonlabored breathing at rest. Cardiac: Regular rate and rhythm, no S3 or significant systolic murmur, no pericardial rub. Abdomen: Soft, nontender, no hepatomegaly, bowel sounds present, no  guarding or rebound. Extremities: No pitting edema, distal pulses 2+. Skin: Warm and dry. Musculoskeletal: No kyphosis. Neuropsychiatric: Alert and oriented x3, affect grossly appropriate.  ECG: EKG today shows normal sinus rhythm with a rate of 75 bpm.   Recent Labwork: Most recent lab work on October 24, 2017 showed hemoglobin to 12.5 and hematocrit of 35.6.  Magnesium: 2.0, sodium 139, potassium 3.2, glucose 121, creatinine 0.94, calcium 8.8, AST 23, ALT 9, GFR greater than 60   Other Studies Reviewed Today:   Assessment and Plan:    1. Palpitations: Previously complained of palpitations after eating shrimp.  Starting Lopressor improved incidence of palpitations.  She admitted mild infrequent symptoms at last visit.  It appears at some point Lopressor was discontinued.  Patient denies any knowledge of taking Lopressor stating she is unfamiliar with those medications.  Currently taking Cardizem only at previously mentioned dose of 180 mg daily.  Denies any recent palpitations or arrhythmias.   2. History of seizures: Patient had a recent admission on March 18, 2018 for seizures.  She was given lorazepam and Keppra. Eventually transitioned to IV Depakote.  There were questions as to alcohol being a trigger for the patient's seizures as well as noncompliance of medications.  She had no further seizures during her hospital stay.  Patient follows up with  Dr. March 20, 2018    3. History of peripartum CM - Echo [place] 06/2009 in the media section of epic shows LVEF normalized to 50-55%.  Patient denies any issues with angina, exertional dyspnea, or lower extremity edema.   4.  Hypertension: Blood pressure at last visit was within normal limits.  Blood pressure within normal limits today.  Continue current medication regimen.  Medication Adjustments/Labs and Tests Ordered: Current medicines are reviewed at length with the patient today.  Concerns regarding medicines are outlined above.     Patient Instructions  Your physician wants you to follow-up in: 1 YEAR WITH DR [facility] You will receive a reminder letter in the mail two months in advance. If you don't receive a letter, please call our office to schedule the follow-up appointment.  Your physician recommends that you continue on your current medications as directed. Please refer to the Current Medication list given to you today.  Thank you for choosing [facility]!!           Signed, [facility], NP 12/03/2018 9:47 AM    [facility] at [facility] [address] [place], [place], [place] Phone: [phone]; [place]: [phone]

## 2018-12-24 DIAGNOSIS — Z79899 Other long term (current) drug therapy: Secondary | ICD-10-CM | POA: Diagnosis not present

## 2018-12-24 DIAGNOSIS — F321 Major depressive disorder, single episode, moderate: Secondary | ICD-10-CM | POA: Diagnosis not present

## 2018-12-24 DIAGNOSIS — G40909 Epilepsy, unspecified, not intractable, without status epilepticus: Secondary | ICD-10-CM | POA: Diagnosis not present

## 2018-12-24 DIAGNOSIS — F439 Reaction to severe stress, unspecified: Secondary | ICD-10-CM | POA: Diagnosis not present

## 2018-12-24 DIAGNOSIS — Z915 Personal history of self-harm: Secondary | ICD-10-CM | POA: Diagnosis not present

## 2018-12-25 DIAGNOSIS — G40909 Epilepsy, unspecified, not intractable, without status epilepticus: Secondary | ICD-10-CM | POA: Diagnosis not present

## 2018-12-25 DIAGNOSIS — F439 Reaction to severe stress, unspecified: Secondary | ICD-10-CM | POA: Diagnosis not present

## 2018-12-25 DIAGNOSIS — Z915 Personal history of self-harm: Secondary | ICD-10-CM | POA: Diagnosis not present

## 2018-12-25 DIAGNOSIS — F321 Major depressive disorder, single episode, moderate: Secondary | ICD-10-CM | POA: Diagnosis not present

## 2018-12-25 DIAGNOSIS — Z79899 Other long term (current) drug therapy: Secondary | ICD-10-CM | POA: Diagnosis not present

## 2019-02-11 ENCOUNTER — Other Ambulatory Visit: Payer: Self-pay | Admitting: Cardiology

## 2019-02-22 DIAGNOSIS — H532 Diplopia: Secondary | ICD-10-CM | POA: Diagnosis not present

## 2019-02-22 DIAGNOSIS — G40311 Generalized idiopathic epilepsy and epileptic syndromes, intractable, with status epilepticus: Secondary | ICD-10-CM | POA: Diagnosis not present

## 2019-02-22 DIAGNOSIS — R413 Other amnesia: Secondary | ICD-10-CM | POA: Diagnosis not present

## 2019-02-22 DIAGNOSIS — G4701 Insomnia due to medical condition: Secondary | ICD-10-CM | POA: Diagnosis not present

## 2019-05-18 DIAGNOSIS — Z79899 Other long term (current) drug therapy: Secondary | ICD-10-CM | POA: Diagnosis not present

## 2019-05-18 DIAGNOSIS — R413 Other amnesia: Secondary | ICD-10-CM | POA: Diagnosis not present

## 2019-05-18 DIAGNOSIS — G40311 Generalized idiopathic epilepsy and epileptic syndromes, intractable, with status epilepticus: Secondary | ICD-10-CM | POA: Diagnosis not present

## 2019-05-18 DIAGNOSIS — G4710 Hypersomnia, unspecified: Secondary | ICD-10-CM | POA: Diagnosis not present

## 2019-07-05 DIAGNOSIS — J9811 Atelectasis: Secondary | ICD-10-CM | POA: Diagnosis not present

## 2019-07-05 DIAGNOSIS — R52 Pain, unspecified: Secondary | ICD-10-CM | POA: Diagnosis not present

## 2019-07-05 DIAGNOSIS — R404 Transient alteration of awareness: Secondary | ICD-10-CM | POA: Diagnosis not present

## 2019-07-05 DIAGNOSIS — R4182 Altered mental status, unspecified: Secondary | ICD-10-CM | POA: Diagnosis not present

## 2019-07-05 DIAGNOSIS — R569 Unspecified convulsions: Secondary | ICD-10-CM | POA: Diagnosis not present

## 2019-07-05 DIAGNOSIS — G4489 Other headache syndrome: Secondary | ICD-10-CM | POA: Diagnosis not present

## 2019-07-05 DIAGNOSIS — R410 Disorientation, unspecified: Secondary | ICD-10-CM | POA: Diagnosis not present

## 2019-08-10 DIAGNOSIS — G40311 Generalized idiopathic epilepsy and epileptic syndromes, intractable, with status epilepticus: Secondary | ICD-10-CM | POA: Diagnosis not present

## 2019-08-10 DIAGNOSIS — Z79899 Other long term (current) drug therapy: Secondary | ICD-10-CM | POA: Diagnosis not present

## 2019-08-10 DIAGNOSIS — G4710 Hypersomnia, unspecified: Secondary | ICD-10-CM | POA: Diagnosis not present

## 2019-08-10 DIAGNOSIS — R413 Other amnesia: Secondary | ICD-10-CM | POA: Diagnosis not present

## 2019-08-11 DIAGNOSIS — R0602 Shortness of breath: Secondary | ICD-10-CM | POA: Diagnosis not present

## 2019-10-11 DIAGNOSIS — R569 Unspecified convulsions: Secondary | ICD-10-CM | POA: Diagnosis not present

## 2019-11-02 DIAGNOSIS — Z79899 Other long term (current) drug therapy: Secondary | ICD-10-CM | POA: Diagnosis not present

## 2019-11-02 DIAGNOSIS — E559 Vitamin D deficiency, unspecified: Secondary | ICD-10-CM | POA: Diagnosis not present

## 2019-11-02 DIAGNOSIS — R413 Other amnesia: Secondary | ICD-10-CM | POA: Diagnosis not present

## 2019-11-02 DIAGNOSIS — E538 Deficiency of other specified B group vitamins: Secondary | ICD-10-CM | POA: Diagnosis not present

## 2019-11-02 DIAGNOSIS — G40311 Generalized idiopathic epilepsy and epileptic syndromes, intractable, with status epilepticus: Secondary | ICD-10-CM | POA: Diagnosis not present

## 2019-11-02 DIAGNOSIS — G4710 Hypersomnia, unspecified: Secondary | ICD-10-CM | POA: Diagnosis not present

## 2019-11-25 ENCOUNTER — Encounter: Payer: Self-pay | Admitting: Cardiology

## 2019-11-25 DIAGNOSIS — R0789 Other chest pain: Secondary | ICD-10-CM | POA: Diagnosis not present

## 2019-11-25 DIAGNOSIS — R079 Chest pain, unspecified: Secondary | ICD-10-CM | POA: Diagnosis not present

## 2019-11-25 DIAGNOSIS — R0602 Shortness of breath: Secondary | ICD-10-CM | POA: Diagnosis not present

## 2019-11-28 ENCOUNTER — Telehealth: Payer: Self-pay | Admitting: Cardiology

## 2019-11-28 ENCOUNTER — Ambulatory Visit: Payer: Medicaid Other | Admitting: Cardiology

## 2019-11-28 NOTE — Telephone Encounter (Signed)
ED notes in care everywhere

## 2019-11-28 NOTE — Telephone Encounter (Signed)
Patient went to [facility] ED on Friday for Chest Pains.  She states her BP has been running around 150/101 and she is experiencing headaches everyday.  Patient request adjustments to her BP meds. Next appt. Available is 01/28/20.

## 2019-11-28 NOTE — Telephone Encounter (Signed)
Pt scheduled for 11/18 @ 11am with Nena Polio, NP - pt aware

## 2019-11-28 NOTE — Telephone Encounter (Signed)
Does Melissa Barrett have anything earlier?  JBrancH MD

## 2019-11-30 NOTE — Progress Notes (Signed)
Cardiology Office Note  Date: 12/01/2019   ID: Melissa Barrett, DOB 10-10-1980, MRN [id number]  PCP:  [facility]  Cardiologist:  Dina Rich, MD Electrophysiologist:  None   Chief Complaint  Patient presents with  . Follow-up    DOE, chest pain, palpitations, syncope    History of Present Illness: Melissa Barrett is a 39 y.o. female with history of peripartum cardiomyopathy, hypertension, history of seizure disorder, palpitations.  Previous history of palpitations after eating shrimp.  After starting Lopressor experienced improvement.  At last visit patient was currently taking Cardizem CD 180 mg daily.  She denies any palpitations or arrhythmias.  On her previous admission in March 2020 for seizures.  She was started on lorazepam and Keppra.  Eventually transitioned to Depakote.  Questions regarding alcohol as being a trigger for seizures as well as noncompliance with medications.  She was following with Dr. [name] neurology.  Blood pressure last visit was within normal limits.  Recent presentation to [facility] ED November 25, 2019 with complaints of chest pain and shortness of breath.  Complained of mid sternal chest pain worse with movement and deep breathing associated with shortness of breath.  Chest wall was tender to palpation.  Blood pressure was elevated at 175/93.  Chest x-ray was negative with exception of mild enlargement of cardiac silhouette.  Initial troponin 11, delta 0, potassium 3.4, creatinine 1.14.  Final diagnosis was chest wall pain.  Phone encounter on 11/28/2019 states patient blood pressure had been running around 150/101 and was experiencing headaches every day.  She was requesting adjustments in her blood pressure medications.  She is here for follow-up post recent ED visit for chest pain, shortness of breath, palpitations, recent syncopal episode.  States she was recently at home sitting and passed out in the floor without any seizure  activity.  She denies any prodromal aura, tonic-clonic activity, or postictal symptoms.  States her shortness of breath has become worse over the last few months or so.  States she can have sudden fast heart palpitations which caused some chest tightness and dizziness.  She denies any CVA or TIA-like symptoms, PND, orthopnea, bleeding issues.  Today her blood pressure is within normal limits at 118/80.  Continues to complain of chest tightness/chest pain associated with exertion.  Denies radiation to neck, arm, back or jaw.  She states the provider at emergency room visit states her EKG showed some type of block.  Past Medical History:  Diagnosis Date  . Acute systolic heart failure (HCC)   . Alcohol abuse   . HTN (hypertension)   . Hypertension   . Hypokalemia   . Pancreatitis   . Peripartum cardiomyopathy    with acute systolic CHF  . Seizures (HCC)   . Tobacco abuse   . Tobacco use disorder     Past Surgical History:  Procedure Laterality Date  . CESAREAN SECTION    . DILATION AND CURETTAGE OF UTERUS    . TUBAL LIGATION      Current Outpatient Medications  Medication Sig Dispense Refill  . amantadine (SYMMETREL) 100 MG capsule amantadine HCl 100 mg capsule  TAKE ONE CAPSULE BY MOUTH TWICE DAILY    . diltiazem (TIAZAC) 180 MG 24 hr capsule TAKE ONE CAPSULE BY MOUTH DAILY. 90 capsule 3  . divalproex (DEPAKOTE) 500 MG DR tablet Take 1 tablet (500 mg total) by mouth 2 (two) times daily. 60 tablet 3  . ergocalciferol (VITAMIN D2) 1.25 MG (50000 UT) capsule Vitamin  D2 1,250 mcg (50,000 unit) capsule  TAKE ONE CAPSULE BY MOUTH EVERY WEEK    . Eslicarbazepine Acetate (APTIOM) 800 MG TABS Take 1 tablet by mouth every evening.    Marland Kitchen ibuprofen (ADVIL,MOTRIN) 800 MG tablet Take 800 mg by mouth every 8 (eight) hours as needed for mild pain or moderate pain.    . perampanel (FYCOMPA) 8 MG tablet Take 8 mg by mouth at bedtime.     No current facility-administered medications for this visit.    Allergies:  Amoxicillin, Amoxicillin, Penicillins, and Penicillins   Social History: The patient  reports that she has been smoking cigarettes. She started smoking about 21 years ago. She has been smoking about 0.50 packs per day. She has never used smokeless tobacco. She reports current alcohol use. She reports that she does not use drugs.   Family History: The patient's family history includes Cancer in her maternal grandmother and paternal grandmother; Hypertension in her father.   ROS:  Please see the history of present illness. Otherwise, complete review of systems is positive for none.  All other systems are reviewed and negative.   Physical Exam: VS:  BP 118/80   Pulse 75   Ht 5\' 6"  (1.676 m)   Wt 218 lb 9.6 oz (99.2 kg)   SpO2 95%   BMI 35.28 kg/m , BMI Body mass index is 35.28 kg/m.  Wt Readings from Last 3 Encounters:  12/01/19 218 lb 9.6 oz (99.2 kg)  12/03/18 206 lb (93.4 kg)  03/18/18 186 lb 8.2 oz (84.6 kg)    General: Patient appears comfortable at rest. Neck: Supple, no elevated JVP or carotid bruits, no thyromegaly. Lungs: Clear to auscultation, nonlabored breathing at rest. Cardiac: Regular rate and rhythm, no S3 or significant systolic murmur, no pericardial rub. Abdomen: Soft, nontender, no hepatomegaly, bowel sounds present, no guarding or rebound. Extremities: No pitting edema, distal pulses 2+. Skin: Warm and dry. Musculoskeletal: No kyphosis. Neuropsychiatric: Alert and oriented x3, affect grossly appropriate.  ECG: EKG today shows normal sinus rhythm with a rate of 75 bpm.   Recent Labwork: Most recent lab work on October 24, 2017 showed hemoglobin to 12.5 and hematocrit of 35.6.  Magnesium: 2.0, sodium 139, potassium 3.2, glucose 121, creatinine 0.94, calcium 8.8, AST 23, ALT 9, GFR greater than 60   Other Studies Reviewed Today:   Assessment and Plan:  1.  DOE Recent emergency room visit for chest pain and DOE.  Patient states her dyspnea on  exertion appears to have gotten worse over the last few months.  Please get repeat echocardiogram.    2.  Chest pain/chest tightness.  States she notes chest tightness when performing exertional tasks.  Please get a Lexiscan stress test to rule out ischemia.  3. Palpitations: Previously complained of palpitations after eating shrimp.  Starting Lopressor improved incidence of palpitations. She admitted mild infrequent symptoms at last visit.  It appears at some point Lopressor was discontinued.  Patient denies any knowledge of taking Lopressor stating she is unfamiliar with those medications.  Currently taking Cardizem only at previously mentioned dose of 180 mg daily.  States she is having some episodes of intermittent rapid heart rates which come on without warning and associated with dizziness.  Had a recent syncopal episode while sitting.  She denied any symptoms of palpitations or rapid heart rates prior to the syncopal episode.  Please get a 14-day ZIO monitor to assess for significant arrhythmias.  4.History of seizures: Patient had a recent admission  on March 18, 2018 for seizures.  She was given lorazepam and Keppra. Eventually transitioned to IV Depakote.  There were questions as to alcohol being a trigger for the patient's seizures as well as noncompliance of medications.  She had no further seizures during her hospital stay.  Patient follows up with  Dr. [name]   5. History of peripartum CM - Echo [facility] 06/2009 in the media section of epic shows LVEF normalized to 50-55%.  Patient denies any issues with angina, exertional dyspnea, or lower extremity edema.  Continue diltiazem 180 mg daily.  6.  Hypertension: Blood pressure at last visit was within normal limits.  Blood pressure within normal limits today at 118/80.  Continue current medication regimen of Cardizem CD 180 mg daily  7.  Syncope Recent syncopal episode at home while sitting.  Patient states she had no seizure activity or  aura prior to syncopal episode.  She had no postictal symptoms.  We are getting a ZIO monitor to check for arrhythmia related syncope  Medication Adjustments/Labs and Tests Ordered: Current medicines are reviewed at length with the patient today.  Concerns regarding medicines are outlined above.    There are no Patient Instructions on file for this visit.       Signed, Rennis Harding, NP 12/01/2019 11:55 AM    [facility] [address] [place], [place], [place] [address] Phone: [phone]; [place]: [phone]

## 2019-12-01 ENCOUNTER — Encounter: Payer: Self-pay | Admitting: *Deleted

## 2019-12-01 ENCOUNTER — Ambulatory Visit (INDEPENDENT_AMBULATORY_CARE_PROVIDER_SITE_OTHER): Payer: Medicaid Other | Admitting: Family Medicine

## 2019-12-01 ENCOUNTER — Encounter: Payer: Self-pay | Admitting: Family Medicine

## 2019-12-01 VITALS — BP 118/80 | HR 75 | Ht 66.0 in | Wt 218.6 lb

## 2019-12-01 DIAGNOSIS — R079 Chest pain, unspecified: Secondary | ICD-10-CM

## 2019-12-01 DIAGNOSIS — I10 Essential (primary) hypertension: Secondary | ICD-10-CM | POA: Diagnosis not present

## 2019-12-01 DIAGNOSIS — R002 Palpitations: Secondary | ICD-10-CM | POA: Diagnosis not present

## 2019-12-01 DIAGNOSIS — Z87898 Personal history of other specified conditions: Secondary | ICD-10-CM

## 2019-12-01 DIAGNOSIS — R0609 Other forms of dyspnea: Secondary | ICD-10-CM

## 2019-12-01 DIAGNOSIS — R55 Syncope and collapse: Secondary | ICD-10-CM

## 2019-12-01 DIAGNOSIS — R0600 Dyspnea, unspecified: Secondary | ICD-10-CM | POA: Diagnosis not present

## 2019-12-01 DIAGNOSIS — O903 Peripartum cardiomyopathy: Secondary | ICD-10-CM

## 2019-12-01 NOTE — Patient Instructions (Signed)
Medication Instructions:  Continue all current medications.  Labwork: none  Testing/Procedures:  Your physician has requested that you have an echocardiogram. Echocardiography is a painless test that uses sound waves to create images of your heart. It provides your doctor with information about the size and shape of your heart and how well your heart's chambers and valves are working. This procedure takes approximately one hour. There are no restrictions for this procedure.  Your physician has requested that you have a lexiscan myoview. For further information please visit [email or web address] Please follow instruction sheet, as given.  Your physician has recommended that you wear an event monitor. Event monitors are medical devices that record the heart's electrical activity. Doctors most often [place] these monitors to diagnose arrhythmias. Arrhythmias are problems with the speed or rhythm of the heartbeat. The monitor is a small, portable device. You can wear one while you do your normal daily activities. This is usually used to diagnose what is causing palpitations/syncope (passing out).  Office will contact with results via phone or letter.    Follow-Up: 6- 8 weeks   Any Other Special Instructions Will Be Listed Below (If Applicable).  If you need a refill on your cardiac medications before your next appointment, please call your pharmacy.

## 2019-12-06 ENCOUNTER — Ambulatory Visit (INDEPENDENT_AMBULATORY_CARE_PROVIDER_SITE_OTHER): Payer: Medicaid Other

## 2019-12-06 DIAGNOSIS — R002 Palpitations: Secondary | ICD-10-CM | POA: Diagnosis not present

## 2019-12-15 ENCOUNTER — Encounter (HOSPITAL_COMMUNITY): Payer: Self-pay

## 2019-12-15 ENCOUNTER — Other Ambulatory Visit: Payer: Self-pay

## 2019-12-15 ENCOUNTER — Ambulatory Visit (HOSPITAL_COMMUNITY)
Admission: RE | Admit: 2019-12-15 | Discharge: 2019-12-15 | Disposition: A | Discharge status: Home / Self Care | Payer: Medicaid Other | Source: Ambulatory Visit | Attending: Family Medicine | Admitting: Family Medicine

## 2019-12-15 ENCOUNTER — Ambulatory Visit (HOSPITAL_BASED_OUTPATIENT_CLINIC_OR_DEPARTMENT_OTHER)
Admission: RE | Admit: 2019-12-15 | Discharge: 2019-12-15 | Disposition: A | Discharge status: Home / Self Care | Payer: Medicaid Other | Source: Ambulatory Visit | Attending: Family Medicine | Admitting: Family Medicine

## 2019-12-15 ENCOUNTER — Encounter (HOSPITAL_COMMUNITY)
Admission: RE | Admit: 2019-12-15 | Discharge: 2019-12-15 | Disposition: A | Discharge status: Home / Self Care | Payer: Medicaid Other | Source: Ambulatory Visit | Attending: Family Medicine | Admitting: Family Medicine

## 2019-12-15 DIAGNOSIS — R079 Chest pain, unspecified: Secondary | ICD-10-CM

## 2019-12-15 DIAGNOSIS — R0609 Other forms of dyspnea: Secondary | ICD-10-CM

## 2019-12-15 DIAGNOSIS — R0600 Dyspnea, unspecified: Secondary | ICD-10-CM

## 2019-12-15 HISTORY — DX: Unspecified asthma, uncomplicated: J45.909

## 2019-12-15 LAB — NM MYOCAR MULTI W/SPECT W/WALL MOTION / EF
LV dias vol: 99 mL (ref 46–106)
LV sys vol: 35 mL
Peak HR: 106 {beats}/min
RATE: 0.7
Rest HR: 62 {beats}/min
SDS: 1
SRS: 0
SSS: 1
TID: 1.07

## 2019-12-15 LAB — ECHOCARDIOGRAM COMPLETE
Area-P 1/2: 2.9 cm2
S' Lateral: 3.6 cm

## 2019-12-15 MED ORDER — TECHNETIUM TC 99M TETROFOSMIN IV KIT
10.0000 | PACK | Freq: Once | INTRAVENOUS | Status: AC | PRN
  Administered 2019-12-15: 11 via INTRAVENOUS

## 2019-12-15 MED ORDER — SODIUM CHLORIDE FLUSH 0.9 % IV SOLN
INTRAVENOUS | Status: AC
  Administered 2019-12-15: 10 mL via INTRAVENOUS
  Filled 2019-12-15: qty 10

## 2019-12-15 MED ORDER — REGADENOSON 0.4 MG/5ML IV SOLN
INTRAVENOUS | Status: AC
  Administered 2019-12-15: 0.4 mg via INTRAVENOUS
  Filled 2019-12-15: qty 5

## 2019-12-15 MED ORDER — TECHNETIUM TC 99M TETROFOSMIN IV KIT
30.0000 | PACK | Freq: Once | INTRAVENOUS | Status: AC | PRN
  Administered 2019-12-15: 31.3 via INTRAVENOUS

## 2019-12-15 NOTE — Progress Notes (Signed)
*  PRELIMINARY RESULTS* Echocardiogram 2D Echocardiogram has been performed.  Stacey Drain 12/15/2019, 11:39 AM

## 2019-12-16 ENCOUNTER — Telehealth: Payer: Self-pay | Admitting: *Deleted

## 2019-12-16 NOTE — Telephone Encounter (Signed)
-----   Message from Netta Neat., NP sent at 12/15/2019  3:53 PM EST ----- Stress test showed no definite lack of blood flow in her heart.  Pumping function looks good.  This is considered a low risk stress test.  Thank you

## 2019-12-16 NOTE — Telephone Encounter (Signed)
Patient informed. Copy sent to PCP °

## 2019-12-16 NOTE — Telephone Encounter (Signed)
-----   Message from Netta Neat., NP sent at 12/15/2019  1:35 PM EST ----- Please call the patient let her know the pumping function of her heart looks good.  She has a trivial leak in her mitral valve.  Nothing that would cause her to have significant shortness of breath related to her heart structure and function

## 2019-12-22 ENCOUNTER — Other Ambulatory Visit (HOSPITAL_COMMUNITY): Payer: Medicaid Other

## 2019-12-30 NOTE — Addendum Note (Signed)
Addended by: Eustace Moore on: 12/30/2019 02:21 PM   Modules accepted: Orders

## 2020-01-02 ENCOUNTER — Telehealth: Payer: Self-pay | Admitting: *Deleted

## 2020-01-02 NOTE — Telephone Encounter (Signed)
-----   Message from Netta Neat., NP sent at 01/02/2020  8:12 AM EST ----- Regarding: ZIO monitor results ZIO monitor on Melissa Barrett showed a normal heart rate of 44 with a maximum heart rate of 138 and average heart rate of 80.  Predominant underlying rhythm was sinus.  No significant arrhythmias noted on the monitor.  Thank you

## 2020-01-02 NOTE — Telephone Encounter (Signed)
Patient informed. Copy sent to PCP °

## 2020-01-12 ENCOUNTER — Ambulatory Visit: Payer: Medicaid Other | Admitting: Family Medicine

## 2020-02-01 ENCOUNTER — Ambulatory Visit: Payer: Medicaid Other | Admitting: Cardiology

## 2020-02-01 NOTE — Progress Notes (Deleted)
Clinical Summary Ms. Grupe is a 40 y.o.female   1. Palpitations - prior admission to [facility] with palpitations, found to be in SVT. This apparently occurred in setting of allergic reaction to shrimp.  - converted to NSR with IV dilt. -D-dimer was elevated at the time, CT PE was negative. Mildly low TSH but normal free T4.   - started on lopressor 25mg  bid. Improved palpitations but caused dizziness, was lowered to 12.5mg  bid.Appears she was changed to dilt 120mg  during recent ER visit - currently on dilt 180mg  daily. Mild infrequent symptoms.    12/2019 event monitor: Rare PACs, PVCs. No significnat arrhytmias  2. History of seizures - started on keppra - followed by neuro    3. 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 - Echo [facility] 06/2009 in the media section of epic shows LVEF normalized to 50-55% -history tubal ligation  4. Chest pain/DOE - 12/2019 nuclear stress: no ischemia - 12/2019 echo: LVEF 55-60%, no WMAs   5. Syncope - syncopal episode at home while sitting - benign echo and event monitor    Past Medical History:  Diagnosis Date  . Acute systolic heart failure (HCC)   . Alcohol abuse   . Asthma   . HTN (hypertension)   . Hypertension   . Hypokalemia   . Pancreatitis   . Peripartum cardiomyopathy    with acute systolic CHF  . Seizures (HCC)   . Tobacco abuse   . Tobacco use disorder      Allergies  Allergen Reactions  . Amoxicillin   . Amoxicillin Hives and Itching  . Penicillins   . Penicillins Hives and Itching    Has patient had a PCN reaction causing immediate rash, facial/tongue/throat swelling, SOB or lightheadedness with hypotension: No Has patient had a PCN reaction causing severe rash involving mucus membranes or skin necrosis: No Has patient had a PCN reaction that required  hospitalization: No Has patient had a PCN reaction occurring within the last 10 years: No If all of the above answers are "NO", then may proceed with Cephalosporin use.      Current Outpatient Medications  Medication Sig Dispense Refill  . amantadine (SYMMETREL) 100 MG capsule amantadine HCl 100 mg capsule  TAKE ONE CAPSULE BY MOUTH TWICE DAILY    . diltiazem (TIAZAC) 180 MG 24 hr capsule TAKE ONE CAPSULE BY MOUTH DAILY. 90 capsule 3  . divalproex (DEPAKOTE) 500 MG DR tablet Take 1 tablet (500 mg total) by mouth 2 (two) times daily. 60 tablet 3  . ergocalciferol (VITAMIN D2) 1.25 MG (50000 UT) capsule Vitamin D2 1,250 mcg (50,000 unit) capsule  TAKE ONE CAPSULE BY MOUTH EVERY WEEK    . Eslicarbazepine Acetate (APTIOM) 800 MG TABS Take 1 tablet by mouth every evening.    07/2009 ibuprofen (ADVIL,MOTRIN) 800 MG tablet Take 800 mg by mouth every 8 (eight) hours as needed for mild pain or moderate pain.    . perampanel (FYCOMPA) 8 MG tablet Take 8 mg by mouth at bedtime.     No current facility-administered medications for this visit.     Past Surgical History:  Procedure Laterality Date  . CESAREAN SECTION    . DILATION AND CURETTAGE OF UTERUS    . TUBAL LIGATION       Allergies  Allergen Reactions  . Amoxicillin   .  Amoxicillin Hives and Itching  . Penicillins   . Penicillins Hives and Itching    Has patient had a PCN reaction causing immediate rash, facial/tongue/throat swelling, SOB or lightheadedness with hypotension: No Has patient had a PCN reaction causing severe rash involving mucus membranes or skin necrosis: No Has patient had a PCN reaction that required hospitalization: No Has patient had a PCN reaction occurring within the last 10 years: No If all of the above answers are "NO", then may proceed with Cephalosporin use.       Family History  Problem Relation Age of Onset  . Hypertension Father   . Cancer Maternal Grandmother   . Cancer Paternal Grandmother       Social History Ms. Alcaraz reports that she has been smoking cigarettes. She started smoking about 21 years ago. She has been smoking about 0.50 packs per day. She has never used smokeless tobacco. Ms. Zent reports current alcohol use.   Review of Systems CONSTITUTIONAL: No weight loss, fever, chills, weakness or fatigue.  HEENT: Eyes: No visual loss, blurred vision, double vision or yellow sclerae.No hearing loss, sneezing, congestion, runny nose or sore throat.  SKIN: No rash or itching.  CARDIOVASCULAR:  RESPIRATORY: No shortness of breath, cough or sputum.  GASTROINTESTINAL: No anorexia, nausea, vomiting or diarrhea. No abdominal pain or blood.  GENITOURINARY: No burning on urination, no polyuria NEUROLOGICAL: No headache, dizziness, syncope, paralysis, ataxia, numbness or tingling in the extremities. No change in bowel or bladder control.  MUSCULOSKELETAL: No muscle, back pain, joint pain or stiffness.  LYMPHATICS: No enlarged nodes. No history of splenectomy.  PSYCHIATRIC: No history of depression or anxiety.  ENDOCRINOLOGIC: No reports of sweating, cold or heat intolerance. No polyuria or polydipsia.  Marland Kitchen   Physical Examination There were no vitals filed for this visit. There were no vitals filed for this visit.  Gen: resting comfortably, no acute distress HEENT: no scleral icterus, pupils equal round and reactive, no palptable cervical adenopathy,  CV Resp: Clear to auscultation bilaterally GI: abdomen is soft, non-tender, non-distended, normal bowel sounds, no hepatosplenomegaly MSK: extremities are warm, no edema.  Skin: warm, no rash Neuro:  no focal deficits Psych: appropriate affect   Diagnostic Studies     Assessment and Plan  1. Palpitations/PSVT -mild infrequent symptoms. We discussed increasing her dilt today but she is in favor of stating at current dose - continue to monitor, continue current meds      Antoine Poche, M.D., F.A.C.C.

## 2020-02-08 ENCOUNTER — Other Ambulatory Visit: Payer: Self-pay | Admitting: *Deleted

## 2020-02-08 ENCOUNTER — Other Ambulatory Visit: Payer: Self-pay | Admitting: Cardiology

## 2020-02-08 MED ORDER — DILTIAZEM HCL ER BEADS 180 MG PO CP24
180.0000 mg | ORAL_CAPSULE | Freq: Every day | ORAL | 1 refills | Status: DC
Start: 2020-02-08 — End: 2020-09-05

## 2020-02-29 ENCOUNTER — Ambulatory Visit: Payer: Medicaid Other | Admitting: Cardiology

## 2020-03-19 DIAGNOSIS — M25561 Pain in right knee: Secondary | ICD-10-CM | POA: Diagnosis not present

## 2020-03-19 DIAGNOSIS — M25461 Effusion, right knee: Secondary | ICD-10-CM | POA: Diagnosis not present

## 2020-03-21 DIAGNOSIS — S83241A Other tear of medial meniscus, current injury, right knee, initial encounter: Secondary | ICD-10-CM | POA: Diagnosis not present

## 2020-03-21 DIAGNOSIS — M25461 Effusion, right knee: Secondary | ICD-10-CM | POA: Diagnosis not present

## 2020-03-21 DIAGNOSIS — M25561 Pain in right knee: Secondary | ICD-10-CM | POA: Diagnosis not present

## 2020-03-21 DIAGNOSIS — W19XXXA Unspecified fall, initial encounter: Secondary | ICD-10-CM | POA: Diagnosis not present

## 2020-04-03 DIAGNOSIS — S83411A Sprain of medial collateral ligament of right knee, initial encounter: Secondary | ICD-10-CM | POA: Diagnosis not present

## 2020-04-03 DIAGNOSIS — M25561 Pain in right knee: Secondary | ICD-10-CM | POA: Diagnosis not present

## 2020-04-03 DIAGNOSIS — M1711 Unilateral primary osteoarthritis, right knee: Secondary | ICD-10-CM | POA: Diagnosis not present

## 2020-04-03 DIAGNOSIS — M25461 Effusion, right knee: Secondary | ICD-10-CM | POA: Diagnosis not present

## 2020-04-05 DIAGNOSIS — M1711 Unilateral primary osteoarthritis, right knee: Secondary | ICD-10-CM | POA: Diagnosis not present

## 2020-04-05 DIAGNOSIS — M25461 Effusion, right knee: Secondary | ICD-10-CM | POA: Diagnosis not present

## 2020-04-05 DIAGNOSIS — W19XXXD Unspecified fall, subsequent encounter: Secondary | ICD-10-CM | POA: Diagnosis not present

## 2020-04-05 DIAGNOSIS — S83411A Sprain of medial collateral ligament of right knee, initial encounter: Secondary | ICD-10-CM | POA: Diagnosis not present

## 2020-04-20 ENCOUNTER — Encounter: Payer: Self-pay | Admitting: *Deleted

## 2020-04-23 ENCOUNTER — Ambulatory Visit (INDEPENDENT_AMBULATORY_CARE_PROVIDER_SITE_OTHER): Payer: Medicaid Other | Admitting: Cardiology

## 2020-04-23 ENCOUNTER — Encounter: Payer: Self-pay | Admitting: Cardiology

## 2020-04-23 VITALS — BP 120/88 | HR 78 | Ht 64.0 in | Wt 212.0 lb

## 2020-04-23 DIAGNOSIS — R0609 Other forms of dyspnea: Secondary | ICD-10-CM

## 2020-04-23 DIAGNOSIS — R0600 Dyspnea, unspecified: Secondary | ICD-10-CM

## 2020-04-23 DIAGNOSIS — R002 Palpitations: Secondary | ICD-10-CM | POA: Diagnosis not present

## 2020-04-23 DIAGNOSIS — R0602 Shortness of breath: Secondary | ICD-10-CM | POA: Diagnosis not present

## 2020-04-23 MED ORDER — VARENICLINE TARTRATE 1 MG PO TABS: 1.0000 mg | ORAL_TABLET | Freq: Two times a day (BID) | ORAL | 0 refills | Status: DC

## 2020-04-23 MED ORDER — VARENICLINE TARTRATE 0.5 MG PO TABS: ORAL_TABLET | ORAL | 0 refills | Status: DC

## 2020-04-23 MED ORDER — VARENICLINE TARTRATE 0.5 MG PO TABS: 0.5000 mg | ORAL_TABLET | Freq: Two times a day (BID) | ORAL | 0 refills | Status: DC

## 2020-04-23 MED ORDER — VARENICLINE TARTRATE 1 MG PO TABS: 1.0000 mg | ORAL_TABLET | Freq: Every day | ORAL | 0 refills | Status: DC

## 2020-04-23 NOTE — Patient Instructions (Addendum)
Your physician recommends that you schedule a follow-up appointment in: 6 MONTHS WITH DR [facility]  Your physician has recommended you make the following change in your medication:   START CHANTIX 0.5 MG DAILY FOR 3 DAYS THEN 0.5 MG TWICE DAILY FOR 4 DAYS THEN 1MG  TWICE DAILY FOR 41 WEEKS  Your physician has recommended that you have a pulmonary function test. Pulmonary Function Tests are a group of tests that measure how well air moves in and out of your lungs.  Thank you for choosing [facility]!!

## 2020-04-23 NOTE — Progress Notes (Signed)
Clinical Summary Melissa Barrett is a 40 y.o.female seen today for follow up of the following medical problems.     1. Palpitations - prior admission to [facility] with palpitations, found to be in SVT. This apparently occurred in setting of allergic reaction to shrimp.  - converted to NSR with IV dilt. -D-dimer was elevated at the time, CT PE was negative. Mildly low TSH but normal free T4.   - started on lopressor 25mg  bid. Improved palpitations but caused dizziness, was lowered to 12.5mg  bid.Appears she was changed to dilt 120mg  during recent ER visit  12/2019 monitor: Rare PACs and PVCs, no significant arrhythmias - 2 cups of coffee in AM, 1 soda at times. No tea, no energy drinks  2. History of seizures - started on keppra - followed by neuro    3. 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 - Echo morehead 06/2009 in the media section of epic shows LVEF normalized to 50-55% -history tubal ligation  4. Chest pain/DOE -12/2019 echo LVEF 55-60%, no WMAs - 12/2019 nuclear stress: no ischemia - she does have a smoking history - ongonig DOE   5. Syncope - no recurrent episodes.   Past Medical History:  Diagnosis Date  . Acute systolic heart failure (HCC)   . Alcohol abuse   . Asthma   . HTN (hypertension)   . Hypertension   . Hypokalemia   . Pancreatitis   . Peripartum cardiomyopathy    with acute systolic CHF  . Seizures (HCC)   . Tobacco abuse   . Tobacco use disorder      Allergies  Allergen Reactions  . Amoxicillin   . Amoxicillin Hives and Itching  . Penicillins   . Penicillins Hives and Itching    Has patient had a PCN reaction causing immediate rash, facial/tongue/throat swelling, SOB or lightheadedness with hypotension: No Has patient had a PCN reaction causing severe rash involving mucus membranes or skin necrosis:  No Has patient had a PCN reaction that required hospitalization: No Has patient had a PCN reaction occurring within the last 10 years: No If all of the above answers are "NO", then may proceed with Cephalosporin use.      Current Outpatient Medications  Medication Sig Dispense Refill  . amantadine (SYMMETREL) 100 MG capsule amantadine HCl 100 mg capsule  TAKE ONE CAPSULE BY MOUTH TWICE DAILY    . diltiazem (TIAZAC) 180 MG 24 hr capsule Take 1 capsule (180 mg total) by mouth daily. 90 capsule 1  . divalproex (DEPAKOTE) 500 MG DR tablet Take 1 tablet (500 mg total) by mouth 2 (two) times daily. 60 tablet 3  . ergocalciferol (VITAMIN D2) 1.25 MG (50000 UT) capsule Vitamin D2 1,250 mcg (50,000 unit) capsule  TAKE ONE CAPSULE BY MOUTH EVERY WEEK    . Eslicarbazepine Acetate (APTIOM) 800 MG TABS Take 1 tablet by mouth every evening.    01/2020 ibuprofen (ADVIL,MOTRIN) 800 MG tablet Take 800 mg by mouth every 8 (eight) hours as needed for mild pain or moderate pain.    . perampanel (FYCOMPA) 8 MG tablet Take 8 mg by mouth at bedtime.     No current facility-administered medications for this visit.     Past Surgical History:  Procedure Laterality Date  . CESAREAN SECTION    . DILATION AND CURETTAGE OF UTERUS    .  TUBAL LIGATION       Allergies  Allergen Reactions  . Amoxicillin   . Amoxicillin Hives and Itching  . Penicillins   . Penicillins Hives and Itching    Has patient had a PCN reaction causing immediate rash, facial/tongue/throat swelling, SOB or lightheadedness with hypotension: No Has patient had a PCN reaction causing severe rash involving mucus membranes or skin necrosis: No Has patient had a PCN reaction that required hospitalization: No Has patient had a PCN reaction occurring within the last 10 years: No If all of the above answers are "NO", then may proceed with Cephalosporin use.       Family History  Problem Relation Age of Onset  . Hypertension Father   . Cancer  Maternal Grandmother   . Cancer Paternal Grandmother      Social History [name] reports that she has been smoking cigarettes. She started smoking about 21 years ago. She has been smoking about 0.50 packs per day. She has never used smokeless tobacco. [name] reports current alcohol use.   Review of Systems CONSTITUTIONAL: No weight loss, fever, chills, weakness or fatigue.  HEENT: Eyes: No visual loss, blurred vision, double vision or yellow sclerae.No hearing loss, sneezing, congestion, runny nose or sore throat.  SKIN: No rash or itching.  CARDIOVASCULAR: per hp RESPIRATORY: No shortness of breath, cough or sputum.  GASTROINTESTINAL: No anorexia, nausea, vomiting or diarrhea. No abdominal pain or blood.  GENITOURINARY: No burning on urination, no polyuria NEUROLOGICAL: No headache, dizziness, syncope, paralysis, ataxia, numbness or tingling in the extremities. No change in bowel or bladder control.  MUSCULOSKELETAL: No muscle, back pain, joint pain or stiffness.  LYMPHATICS: No enlarged nodes. No history of splenectomy.  PSYCHIATRIC: No history of depression or anxiety.  ENDOCRINOLOGIC: No reports of sweating, cold or heat intolerance. No polyuria or polydipsia.  Marland Kitchen   Physical Examination Today's Vitals   04/23/20 0836  BP: 120/88  Pulse: 78  SpO2: 98%  Weight: 212 lb (96.2 kg)  Height: 5\' 4"  (1.626 m)   Body mass index is 36.39 kg/m.  Gen: resting comfortably, no acute distress HEENT: no scleral icterus, pupils equal round and reactive, no palptable cervical adenopathy,  CV: RRR, no m/r/g, no jvd Resp: Clear to auscultation bilaterally GI: abdomen is soft, non-tender, non-distended, normal bowel sounds, no hepatosplenomegaly MSK: extremities are warm, no edema.  Skin: warm, no rash Neuro:  no focal deficits Psych: appropriate affect   Diagnostic Studies  12/2019 echo 1. Left ventricular ejection fraction, by estimation, is 55 to 60%. The  left ventricle has  normal function. The left ventricle has no regional  wall motion abnormalities. Left ventricular diastolic parameters were  normal.  2. Right ventricular systolic function is normal. The right ventricular  size is normal. There is normal pulmonary artery systolic pressure. The  estimated right ventricular systolic pressure is 18.4 mmHg.  3. Left atrial size was mildly dilated.  4. The mitral valve is grossly normal. Trivial mitral valve  regurgitation.  5. The aortic valve is tricuspid. Aortic valve regurgitation is not  visualized.  6. The inferior vena cava is normal in size with greater than 50%  respiratory variability, suggesting right atrial pressure of 3 mmHg.    Assessment and Plan  1. Palpitations/PSVT -recent monitor with rare ectopy - symptosm tolerable at this time, work on weaning caffeine. If progression would titrate diltiazem  2. DOE - recent benign cardiac workup with echo and stress test - long smoking history, obtain  PFTs - if benign PFTs likely secondary to deconditioning and weight gain over the last few years.       Antoine Poche, M.D.

## 2020-04-24 ENCOUNTER — Telehealth: Payer: Self-pay | Admitting: *Deleted

## 2020-04-24 NOTE — Addendum Note (Signed)
Addended by: Burman Nieves T on: 04/24/2020 03:06 PM   Modules accepted: Orders

## 2020-04-24 NOTE — Telephone Encounter (Signed)
Per [place] and pt [employer] is no longer producing Chantix and wanting to know if Dr Wyline Mood recommended any other medications for smoking

## 2020-04-30 NOTE — Telephone Encounter (Signed)
Pt voiced understanding

## 2020-04-30 NOTE — Telephone Encounter (Signed)
There is another pill that's used called wellbutrin but I would defer to her pcp to prescribe since it is also an antidepressant  Dominga Ferry MD

## 2020-05-16 DIAGNOSIS — E559 Vitamin D deficiency, unspecified: Secondary | ICD-10-CM | POA: Diagnosis not present

## 2020-05-16 DIAGNOSIS — Z79899 Other long term (current) drug therapy: Secondary | ICD-10-CM | POA: Diagnosis not present

## 2020-05-16 DIAGNOSIS — R413 Other amnesia: Secondary | ICD-10-CM | POA: Diagnosis not present

## 2020-05-16 DIAGNOSIS — G4710 Hypersomnia, unspecified: Secondary | ICD-10-CM | POA: Diagnosis not present

## 2020-05-16 DIAGNOSIS — E538 Deficiency of other specified B group vitamins: Secondary | ICD-10-CM | POA: Diagnosis not present

## 2020-05-16 DIAGNOSIS — G40311 Generalized idiopathic epilepsy and epileptic syndromes, intractable, with status epilepticus: Secondary | ICD-10-CM | POA: Diagnosis not present

## 2020-05-17 DIAGNOSIS — M1711 Unilateral primary osteoarthritis, right knee: Secondary | ICD-10-CM | POA: Diagnosis not present

## 2020-05-17 DIAGNOSIS — W19XXXD Unspecified fall, subsequent encounter: Secondary | ICD-10-CM | POA: Diagnosis not present

## 2020-05-17 DIAGNOSIS — S83411A Sprain of medial collateral ligament of right knee, initial encounter: Secondary | ICD-10-CM | POA: Diagnosis not present

## 2020-06-01 ENCOUNTER — Other Ambulatory Visit: Payer: Self-pay

## 2020-06-01 ENCOUNTER — Other Ambulatory Visit (HOSPITAL_COMMUNITY)
Admission: RE | Admit: 2020-06-01 | Discharge: 2020-06-01 | Disposition: A | Discharge status: Home / Self Care | Payer: Medicaid Other | Source: Ambulatory Visit | Attending: Cardiology | Admitting: Cardiology

## 2020-06-01 DIAGNOSIS — Z01812 Encounter for preprocedural laboratory examination: Secondary | ICD-10-CM | POA: Diagnosis not present

## 2020-06-01 DIAGNOSIS — Z20822 Contact with and (suspected) exposure to covid-19: Secondary | ICD-10-CM | POA: Diagnosis not present

## 2020-06-01 LAB — SARS CORONAVIRUS 2 (TAT 6-24 HRS): SARS Coronavirus 2: NEGATIVE

## 2020-06-05 ENCOUNTER — Other Ambulatory Visit: Payer: Self-pay

## 2020-06-05 ENCOUNTER — Ambulatory Visit (HOSPITAL_COMMUNITY)
Admission: RE | Admit: 2020-06-05 | Discharge: 2020-06-05 | Disposition: A | Discharge status: Home / Self Care | Payer: Medicaid Other | Source: Ambulatory Visit | Attending: Cardiology | Admitting: Cardiology

## 2020-06-05 DIAGNOSIS — R0602 Shortness of breath: Secondary | ICD-10-CM | POA: Diagnosis present

## 2020-06-05 LAB — PULMONARY FUNCTION TEST
DL/VA % pred: 90 %
DL/VA: 4.01 ml/min/mmHg/L
DLCO unc % pred: 68 %
DLCO unc: 15.54 ml/min/mmHg
FEF 25-75 Post: 3.44 L/sec
FEF 25-75 Pre: 2.53 L/sec
FEF2575-%Change-Post: 36 %
FEF2575-%Pred-Post: 105 %
FEF2575-%Pred-Pre: 77 %
FEV1-%Change-Post: 8 %
FEV1-%Pred-Post: 82 %
FEV1-%Pred-Pre: 76 %
FEV1-Post: 2.61 L
FEV1-Pre: 2.4 L
FEV1FVC-%Change-Post: 6 %
FEV1FVC-%Pred-Pre: 99 %
FEV6-%Change-Post: 2 %
FEV6-%Pred-Post: 78 %
FEV6-%Pred-Pre: 76 %
FEV6-Post: 2.97 L
FEV6-Pre: 2.9 L
FEV6FVC-%Change-Post: 0 %
FEV6FVC-%Pred-Post: 101 %
FEV6FVC-%Pred-Pre: 101 %
FVC-%Change-Post: 2 %
FVC-%Pred-Post: 77 %
FVC-%Pred-Pre: 75 %
FVC-Post: 2.97 L
FVC-Pre: 2.91 L
Post FEV1/FVC ratio: 88 %
Post FEV6/FVC ratio: 100 %
Pre FEV1/FVC ratio: 82 %
Pre FEV6/FVC Ratio: 100 %
RV % pred: 102 %
RV: 1.63 L
TLC % pred: 88 %
TLC: 4.6 L

## 2020-06-05 MED ORDER — ALBUTEROL SULFATE (2.5 MG/3ML) 0.083% IN NEBU
2.5000 mg | INHALATION_SOLUTION | Freq: Once | RESPIRATORY_TRACT | Status: AC
  Administered 2020-06-05: 2.5 mg via RESPIRATORY_TRACT

## 2020-06-21 DIAGNOSIS — M1711 Unilateral primary osteoarthritis, right knee: Secondary | ICD-10-CM | POA: Diagnosis not present

## 2020-06-25 ENCOUNTER — Telehealth: Payer: Self-pay | Admitting: Cardiology

## 2020-06-25 NOTE — Telephone Encounter (Signed)
Lesle Chris, LPN  6/76/1949  1:57 PM EDT Back to Top     Notified, copy to pcp.    Lesle Chris, LPN  9/32/6712 45:80 PM EDT      Left message to return call.   Antoine Poche, MD  06/25/2020 11:49 AM EDT      Breathing tests overall look fine, just minor changes associated with smokignand weight gain, no major findings     Dominga Ferry MD

## 2020-06-25 NOTE — Telephone Encounter (Signed)
New message    Patient is returning call to [facility]

## 2020-09-05 ENCOUNTER — Other Ambulatory Visit: Payer: Self-pay | Admitting: Cardiology

## 2020-09-21 ENCOUNTER — Telehealth: Payer: Self-pay | Admitting: Cardiology

## 2020-09-21 DIAGNOSIS — M1711 Unilateral primary osteoarthritis, right knee: Secondary | ICD-10-CM | POA: Diagnosis not present

## 2020-09-21 NOTE — Telephone Encounter (Signed)
Office visit is scheduled for 09/24/2020 with Dr. Wyline Mood.

## 2020-09-21 NOTE — Telephone Encounter (Signed)
Pt came into office stating her BP is running high and she's had a headache for 3 days. Said she was at therapy for her knee and it was 100 something? / 90   Pt is going to monitor BP over the weekend, moved apt w/ Dr. Wyline Mood up sooner and gave her a log to record BP readings over the weekend.   Please give pt a call @ [phone]

## 2020-09-24 ENCOUNTER — Encounter: Payer: Self-pay | Admitting: Cardiology

## 2020-09-24 ENCOUNTER — Ambulatory Visit (INDEPENDENT_AMBULATORY_CARE_PROVIDER_SITE_OTHER): Payer: Medicaid Other | Admitting: Cardiology

## 2020-09-24 VITALS — BP 128/76 | HR 88 | Ht 64.0 in | Wt 209.0 lb

## 2020-09-24 DIAGNOSIS — R002 Palpitations: Secondary | ICD-10-CM | POA: Diagnosis not present

## 2020-09-24 DIAGNOSIS — R0600 Dyspnea, unspecified: Secondary | ICD-10-CM

## 2020-09-24 DIAGNOSIS — I10 Essential (primary) hypertension: Secondary | ICD-10-CM

## 2020-09-24 DIAGNOSIS — K219 Gastro-esophageal reflux disease without esophagitis: Secondary | ICD-10-CM | POA: Diagnosis not present

## 2020-09-24 DIAGNOSIS — R0609 Other forms of dyspnea: Secondary | ICD-10-CM

## 2020-09-24 MED ORDER — DILTIAZEM HCL ER BEADS 240 MG PO CP24: 240.0000 mg | ORAL_CAPSULE | Freq: Every day | ORAL | 6 refills | Status: DC

## 2020-09-24 MED ORDER — PANTOPRAZOLE SODIUM 40 MG PO TBEC: 40.0000 mg | DELAYED_RELEASE_TABLET | Freq: Every day | ORAL | 6 refills | Status: DC

## 2020-09-24 NOTE — Patient Instructions (Signed)
Medication Instructions:  Increase Diltiazem CD to 240mg  daily. Begin Protonix 40mg  daily.  Continue all other medications.     Labwork: none  Testing/Procedures: none  Follow-Up: 6 months   Any Other Special Instructions Will Be Listed Below (If Applicable).   If you need a refill on your cardiac medications before your next appointment, please call your pharmacy.

## 2020-09-24 NOTE — Progress Notes (Signed)
Clinical Summary Melissa Barrett is a 40 y.o.femaleseen today for follow up of the following medical problems.       1. Palpitations - prior admission to [facility] with palpitations, found to be in SVT. This apparently occurred in setting of allergic reaction to shrimp.  - converted to NSR with IV dilt. - D-dimer was elevated at the time, CT PE was negative. Mildly low TSH but normal free T4.      - started on lopressor 25mg  bid. Improved palpitations but caused dizziness, was lowered to 12.5mg  bid. Appears she was changed to dilt 120mg  during recent ER visit   12/2019 monitor: Rare PACs and PVCs, no significant arrhythmias   - occasional palpitations that can be bothersome - limiting caffeine   2. History of seizures - started on keppra - followed by neuro       3. 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 - Echo morehead 06/2009 in the media section of epic shows LVEF normalized to 50-55% - history tubal ligation   4. Chest pain/DOE -12/2019 echo LVEF 55-60%, no WMAs - 12/2019 nuclear stress: no ischemia - she does have a smoking history - ongonig DOE  - ongoign SOB/DOE.      5. Syncope - no recurrent episodes.    6. GERD - has tried prilosec without benefit, rolaids - burning pain with laying down. - taking ibuprofen 3 days out of the week.   7. HTN - recent high bp's at home - notes recent severe knee pains, followed by ortho    Has 39 year old twins, a boy and a girl. 2 older kids, 4 grandkids    Past Medical History:  Diagnosis Date   Acute systolic heart failure (HCC)    Alcohol abuse    Asthma    HTN (hypertension)    Hypertension    Hypokalemia    Pancreatitis    Peripartum cardiomyopathy    with acute systolic CHF   Seizures (HCC)    Tobacco abuse    Tobacco use disorder      Allergies  Allergen Reactions    Shrimp Extract Allergy Skin Test Shortness Of Breath and Rash   Amoxicillin    Amoxicillin Hives and Itching   Penicillins    Penicillins Hives and Itching    Has patient had a PCN reaction causing immediate rash, facial/tongue/throat swelling, SOB or lightheadedness with hypotension: No Has patient had a PCN reaction causing severe rash involving mucus membranes or skin necrosis: No Has patient had a PCN reaction that required hospitalization: No Has patient had a PCN reaction occurring within the last 10 years: No If all of the above answers are "NO", then may proceed with Cephalosporin use.      Current Outpatient Medications  Medication Sig Dispense Refill   amantadine (SYMMETREL) 100 MG capsule amantadine HCl 100 mg capsule  TAKE ONE CAPSULE BY MOUTH TWICE DAILY     divalproex (DEPAKOTE) 500 MG DR tablet Take 1 tablet (500 mg total) by mouth 2 (two) times daily. 60 tablet 3   ergocalciferol (VITAMIN D2) 1.25 MG (50000 UT) capsule Vitamin D2 1,250 mcg (50,000 unit) capsule  TAKE ONE CAPSULE BY MOUTH EVERY WEEK     Eslicarbazepine Acetate (APTIOM) 800 MG TABS Take 1 tablet by mouth every evening.     ibuprofen (ADVIL,MOTRIN)  800 MG tablet Take 800 mg by mouth every 6 (six) hours as needed for mild pain or moderate pain.     perampanel (FYCOMPA) 8 MG tablet Take 8 mg by mouth at bedtime.     TIADYLT ER 180 MG 24 hr capsule TAKE ONE CAPSULE BY MOUTH DAILY 90 capsule 1   No current facility-administered medications for this visit.     Past Surgical History:  Procedure Laterality Date   CESAREAN SECTION     DILATION AND CURETTAGE OF UTERUS     TUBAL LIGATION       Allergies  Allergen Reactions   Shrimp Extract Allergy Skin Test Shortness Of Breath and Rash   Amoxicillin    Amoxicillin Hives and Itching   Penicillins    Penicillins Hives and Itching    Has patient had a PCN reaction causing immediate rash, facial/tongue/throat swelling, SOB or lightheadedness with  hypotension: No Has patient had a PCN reaction causing severe rash involving mucus membranes or skin necrosis: No Has patient had a PCN reaction that required hospitalization: No Has patient had a PCN reaction occurring within the last 10 years: No If all of the above answers are "NO", then may proceed with Cephalosporin use.       Family History  Problem Relation Age of Onset   Hypertension Father    Cancer Maternal Grandmother    Cancer Paternal Grandmother      Social History Melissa Barrett reports that she has been smoking cigarettes. She started smoking about 22 years ago. She has been smoking an average of .5 packs per day. She has never used smokeless tobacco. Melissa Barrett reports current alcohol use.   Review of Systems CONSTITUTIONAL: No weight loss, fever, chills, weakness or fatigue.  HEENT: Eyes: No visual loss, blurred vision, double vision or yellow sclerae.No hearing loss, sneezing, congestion, runny nose or sore throat.  SKIN: No rash or itching.  CARDIOVASCULAR: per hpi RESPIRATORY: No shortness of breath, cough or sputum.  GASTROINTESTINAL: No anorexia, nausea, vomiting or diarrhea. No abdominal pain or blood.  GENITOURINARY: No burning on urination, no polyuria NEUROLOGICAL: No headache, dizziness, syncope, paralysis, ataxia, numbness or tingling in the extremities. No change in bowel or bladder control.  MUSCULOSKELETAL: No muscle, back pain, joint pain or stiffness.  LYMPHATICS: No enlarged nodes. No history of splenectomy.  PSYCHIATRIC: No history of depression or anxiety.  ENDOCRINOLOGIC: No reports of sweating, cold or heat intolerance. No polyuria or polydipsia.  Marland Kitchen   Physical Examination Today's Vitals   09/24/20 1347  BP: 128/76  Pulse: 88  SpO2: 97%  Weight: 209 lb (94.8 kg)  Height: 5\' 4"  (1.626 m)   Body mass index is 35.87 kg/m.  Gen: resting comfortably, no acute distress HEENT: no scleral icterus, pupils equal round and reactive, no palptable  cervical adenopathy,  CV: RRR, no m/r/g no jvd Resp: Clear to auscultation bilaterally GI: abdomen is soft, non-tender, non-distended, normal bowel sounds, no hepatosplenomegaly MSK: extremities are warm, no edema.  Skin: warm, no rash Neuro:  no focal deficits Psych: appropriate affect   05/2020 PFTs No significant abnormalities  Diagnostic Studies  12/2019 echo 1. Left ventricular ejection fraction, by estimation, is 55 to 60%. The  left ventricle has normal function. The left ventricle has no regional  wall motion abnormalities. Left ventricular diastolic parameters were  normal.   2. Right ventricular systolic function is normal. The right ventricular  size is normal. There is normal pulmonary artery systolic pressure. The  estimated right ventricular systolic pressure is 18.4 mmHg.   3. Left atrial size was mildly dilated.   4. The mitral valve is grossly normal. Trivial mitral valve  regurgitation.   5. The aortic valve is tricuspid. Aortic valve regurgitation is not  visualized.   6. The inferior vena cava is normal in size with greater than 50%  respiratory variability, suggesting right atrial pressure of 3 mmHg.    Assessment and Plan  1. Palpitations/PSVT - recent monitor with rare ectopy - some ongoing symptoms, increase dilt to 240mg  daily - EKG today shows NSR  2. HTN - elevated bp's at home, at goal here - recent significant issues with knee pain, taking some NSAIDs, may be related - increasing dilt for palpitations, follow bp's with higher dose.    3. DOE - recent benign cardiac workup with echo and stress test - PFTs minimal obstruction, some restriction, mild diffusino defect - suspect symptoms due to deconditioing, some restrictive lung disease and weight gain. - no further cardiac workup at this time.   4. GERD - significant symptoms not improved with OTC antacids -will prescibe protonix 40mg  daily. Discussed limiting NSAID use. If ongoing symptoms  on protonix would refer to GI    , M.D.,

## 2020-10-22 DIAGNOSIS — G40311 Generalized idiopathic epilepsy and epileptic syndromes, intractable, with status epilepticus: Secondary | ICD-10-CM | POA: Diagnosis not present

## 2020-10-22 DIAGNOSIS — G4710 Hypersomnia, unspecified: Secondary | ICD-10-CM | POA: Diagnosis not present

## 2020-10-22 DIAGNOSIS — G4489 Other headache syndrome: Secondary | ICD-10-CM | POA: Diagnosis not present

## 2020-10-22 DIAGNOSIS — E538 Deficiency of other specified B group vitamins: Secondary | ICD-10-CM | POA: Diagnosis not present

## 2020-10-22 DIAGNOSIS — R413 Other amnesia: Secondary | ICD-10-CM | POA: Diagnosis not present

## 2020-10-22 DIAGNOSIS — E559 Vitamin D deficiency, unspecified: Secondary | ICD-10-CM | POA: Diagnosis not present

## 2020-10-29 ENCOUNTER — Ambulatory Visit: Payer: Medicaid Other | Admitting: Cardiology

## 2020-11-07 DIAGNOSIS — Z20822 Contact with and (suspected) exposure to covid-19: Secondary | ICD-10-CM | POA: Diagnosis not present

## 2020-11-07 DIAGNOSIS — R8290 Unspecified abnormal findings in urine: Secondary | ICD-10-CM | POA: Diagnosis not present

## 2020-11-07 DIAGNOSIS — R42 Dizziness and giddiness: Secondary | ICD-10-CM | POA: Diagnosis not present

## 2020-11-07 DIAGNOSIS — Z720 Tobacco use: Secondary | ICD-10-CM | POA: Diagnosis not present

## 2020-11-08 DIAGNOSIS — R42 Dizziness and giddiness: Secondary | ICD-10-CM | POA: Diagnosis not present

## 2020-12-21 DIAGNOSIS — M1711 Unilateral primary osteoarthritis, right knee: Secondary | ICD-10-CM | POA: Diagnosis not present

## 2020-12-21 DIAGNOSIS — M659 Synovitis and tenosynovitis, unspecified: Secondary | ICD-10-CM | POA: Diagnosis not present

## 2020-12-21 DIAGNOSIS — M791 Myalgia, unspecified site: Secondary | ICD-10-CM | POA: Diagnosis not present

## 2021-03-25 ENCOUNTER — Encounter: Payer: Self-pay | Admitting: Cardiology

## 2021-03-25 ENCOUNTER — Ambulatory Visit: Payer: Medicaid Other | Admitting: Cardiology

## 2021-03-25 NOTE — Progress Notes (Deleted)
? ? ? ?Clinical Summary ?Ms. Bolanos is a 41 y.o.femaleseen today for follow up of the following medical problems.  ?  ?  ? 1. Palpitations ?- prior admission to [facility] with palpitations, found to be in SVT. This apparently occurred in setting of allergic reaction to shrimp.  ?- converted to NSR with IV dilt. ?- D-dimer was elevated at the time, CT PE was negative. Mildly low TSH but normal free T4.  ?  ?  ?- started on lopressor 25mg  bid. Improved palpitations but caused dizziness, was lowered to 12.5mg  bid. Appears she was changed to dilt 120mg  during recent ER visit ?  ?12/2019 monitor: Rare PACs and PVCs, no significant arrhythmias ?  ?  ?- occasional palpitations that can be bothersome ?- limiting caffeine ?  ?2. History of seizures ?- started on keppra ?- followed by neuro ?  ?  ?  ?3. History of peripartum CM ?- mention of LVEF as low as 15-20% in 2010 ?]- she was medically managed for this, but has not followed with anyone in our practice since 2011. ?- Echo morehead 06/2009 in the media section of epic shows LVEF normalized to 50-55% ?- history tubal ligation ?  ?4. Chest pain/DOE ?-12/2019 echo LVEF 55-60%, no WMAs ?- 12/2019 nuclear stress: no ischemia ?- she does have a smoking history ?- ongonig DOE ?  ?- ongoign SOB/DOE.  ?  ?  ?5. Syncope ?- no recurrent episodes.  ?  ?6. GERD ?- has tried prilosec without benefit, rolaids ?- burning pain with laying down. ?- taking ibuprofen 3 days out of the week.  ?  ?7. HTN ?- recent high bp's at home ?- notes recent severe knee pains, followed by ortho ?  ?  ?  ?Has 40 year old twins, a boy and a girl. 2 older kids, 4 grandkids ? ? ?Past Medical History:  ?Diagnosis Date  ? Acute systolic heart failure (HCC)   ? Alcohol abuse   ? Asthma   ? HTN (hypertension)   ? Hypertension   ? Hypokalemia   ? Pancreatitis   ? Peripartum cardiomyopathy   ? with acute systolic CHF  ? Seizures (HCC)   ? Tobacco abuse   ? Tobacco use disorder   ? ? ? ?Allergies  ?Allergen Reactions   ? Shrimp Extract Allergy Skin Test Shortness Of Breath and Rash  ? Amoxicillin   ? Amoxicillin Hives and Itching  ? Penicillins   ? Penicillins Hives and Itching  ?  Has patient had a PCN reaction causing immediate rash, facial/tongue/throat swelling, SOB or lightheadedness with hypotension: No ?Has patient had a PCN reaction causing severe rash involving mucus membranes or skin necrosis: No ?Has patient had a PCN reaction that required hospitalization: No ?Has patient had a PCN reaction occurring within the last 10 years: No ?If all of the above answers are "NO", then may proceed with Cephalosporin use. ?  ? ? ? ?Current Outpatient Medications  ?Medication Sig Dispense Refill  ? amantadine (SYMMETREL) 100 MG capsule amantadine HCl 100 mg capsule ? TAKE ONE CAPSULE BY MOUTH TWICE DAILY    ? diltiazem (TIADYLT ER) 240 MG 24 hr capsule Take 1 capsule (240 mg total) by mouth daily. 30 capsule 6  ? divalproex (DEPAKOTE) 500 MG DR tablet Take 1 tablet (500 mg total) by mouth 2 (two) times daily. 60 tablet 3  ? ergocalciferol (VITAMIN D2) 1.25 MG (50000 UT) capsule Vitamin D2 1,250 mcg (50,000 unit) capsule ? TAKE ONE  CAPSULE BY MOUTH EVERY WEEK    ? Eslicarbazepine Acetate (APTIOM) 800 MG TABS Take 1 tablet by mouth every evening.    ? ibuprofen (ADVIL,MOTRIN) 800 MG tablet Take 800 mg by mouth every 6 (six) hours as needed for mild pain or moderate pain.    ? pantoprazole (PROTONIX) 40 MG tablet Take 1 tablet (40 mg total) by mouth daily. 30 tablet 6  ? perampanel (FYCOMPA) 8 MG tablet Take 8 mg by mouth at bedtime.    ? ?No current facility-administered medications for this visit.  ? ? ? ?Past Surgical History:  ?Procedure Laterality Date  ? CESAREAN SECTION    ? DILATION AND CURETTAGE OF UTERUS    ? TUBAL LIGATION    ? ? ? ?Allergies  ?Allergen Reactions  ? Shrimp Extract Allergy Skin Test Shortness Of Breath and Rash  ? Amoxicillin   ? Amoxicillin Hives and Itching  ? Penicillins   ? Penicillins Hives and Itching  ?   Has patient had a PCN reaction causing immediate rash, facial/tongue/throat swelling, SOB or lightheadedness with hypotension: No ?Has patient had a PCN reaction causing severe rash involving mucus membranes or skin necrosis: No ?Has patient had a PCN reaction that required hospitalization: No ?Has patient had a PCN reaction occurring within the last 10 years: No ?If all of the above answers are "NO", then may proceed with Cephalosporin use. ?  ? ? ? ? ?Family History  ?Problem Relation Age of Onset  ? Hypertension Father   ? Cancer Maternal Grandmother   ? Cancer Paternal Grandmother   ? ? ? ?Social History ?Ms. Muckleroy reports that she has been smoking cigarettes. She started smoking about 22 years ago. She has been smoking an average of .5 packs per day. She has never used smokeless tobacco. ?Ms. Bronaugh reports current alcohol use. ? ? ?Review of Systems ?CONSTITUTIONAL: No weight loss, fever, chills, weakness or fatigue.  ?HEENT: Eyes: No visual loss, blurred vision, double vision or yellow sclerae.No hearing loss, sneezing, congestion, runny nose or sore throat.  ?SKIN: No rash or itching.  ?CARDIOVASCULAR:  ?RESPIRATORY: No shortness of breath, cough or sputum.  ?GASTROINTESTINAL: No anorexia, nausea, vomiting or diarrhea. No abdominal pain or blood.  ?GENITOURINARY: No burning on urination, no polyuria ?NEUROLOGICAL: No headache, dizziness, syncope, paralysis, ataxia, numbness or tingling in the extremities. No change in bowel or bladder control.  ?MUSCULOSKELETAL: No muscle, back pain, joint pain or stiffness.  ?LYMPHATICS: No enlarged nodes. No history of splenectomy.  ?PSYCHIATRIC: No history of depression or anxiety.  ?ENDOCRINOLOGIC: No reports of sweating, cold or heat intolerance. No polyuria or polydipsia.  ?. ? ? ?Physical Examination ?There were no vitals filed for this visit. ?There were no vitals filed for this visit. ? ?Gen: resting comfortably, no acute distress ?HEENT: no scleral icterus, pupils  equal round and reactive, no palptable cervical adenopathy,  ?CV ?Resp: Clear to auscultation bilaterally ?GI: abdomen is soft, non-tender, non-distended, normal bowel sounds, no hepatosplenomegaly ?MSK: extremities are warm, no edema.  ?Skin: warm, no rash ?Neuro:  no focal deficits ?Psych: appropriate affect ? ? ?Diagnostic Studies ? ?12/2019 echo ?1. Left ventricular ejection fraction, by estimation, is 55 to 60%. The  ?left ventricle has normal function. The left ventricle has no regional  ?wall motion abnormalities. Left ventricular diastolic parameters were  ?normal.  ? 2. Right ventricular systolic function is normal. The right ventricular  ?size is normal. There is normal pulmonary artery systolic pressure. The  ?estimated right  ventricular systolic pressure is 18.4 mmHg.  ? 3. Left atrial size was mildly dilated.  ? 4. The mitral valve is grossly normal. Trivial mitral valve  ?regurgitation.  ? 5. The aortic valve is tricuspid. Aortic valve regurgitation is not  ?visualized.  ? 6. The inferior vena cava is normal in size with greater than 50%  ?respiratory variability, suggesting right atrial pressure of 3 mmHg.  ?  ? ? ?Assessment and Plan  ?1. Palpitations/PSVT ?- recent monitor with rare ectopy ?- some ongoing symptoms, increase dilt to 240mg  daily ?- EKG today shows NSR ?  ?2. HTN ?- elevated bp's at home, at goal here ?- recent significant issues with knee pain, taking some NSAIDs, may be related ?- increasing dilt for palpitations, follow bp's with higher dose.  ?  ?3. DOE ?- recent benign cardiac workup with echo and stress test ?- PFTs minimal obstruction, some restriction, mild diffusino defect ?- suspect symptoms due to deconditioing, some restrictive lung disease and weight gain. ?- no further cardiac workup at this time.  ?  ?4. GERD ?- significant symptoms not improved with OTC antacids ?-will prescibe protonix 40mg  daily. Discussed limiting NSAID use. If ongoing symptoms on protonix would  refer to GI ? ? ? ? ? ?Antoine PocheJonathan F. Alok Minshall, M.D., F.A.C.C. ?

## 2021-03-27 DIAGNOSIS — M1711 Unilateral primary osteoarthritis, right knee: Secondary | ICD-10-CM | POA: Diagnosis not present

## 2021-03-27 DIAGNOSIS — M659 Synovitis and tenosynovitis, unspecified: Secondary | ICD-10-CM | POA: Diagnosis not present

## 2021-04-05 DIAGNOSIS — M1711 Unilateral primary osteoarthritis, right knee: Secondary | ICD-10-CM | POA: Diagnosis not present

## 2021-04-18 DIAGNOSIS — G4733 Obstructive sleep apnea (adult) (pediatric): Secondary | ICD-10-CM | POA: Diagnosis not present

## 2021-04-18 DIAGNOSIS — F319 Bipolar disorder, unspecified: Secondary | ICD-10-CM | POA: Diagnosis not present

## 2021-04-18 DIAGNOSIS — G40219 Localization-related (focal) (partial) symptomatic epilepsy and epileptic syndromes with complex partial seizures, intractable, without status epilepticus: Secondary | ICD-10-CM | POA: Diagnosis not present

## 2021-04-20 DIAGNOSIS — G40219 Localization-related (focal) (partial) symptomatic epilepsy and epileptic syndromes with complex partial seizures, intractable, without status epilepticus: Secondary | ICD-10-CM | POA: Insufficient documentation

## 2021-05-07 ENCOUNTER — Other Ambulatory Visit: Payer: Self-pay | Admitting: Cardiology

## 2021-05-09 DIAGNOSIS — F411 Generalized anxiety disorder: Secondary | ICD-10-CM | POA: Insufficient documentation

## 2021-05-09 DIAGNOSIS — F331 Major depressive disorder, recurrent, moderate: Secondary | ICD-10-CM | POA: Insufficient documentation

## 2021-05-10 DIAGNOSIS — R001 Bradycardia, unspecified: Secondary | ICD-10-CM | POA: Diagnosis not present

## 2021-05-21 ENCOUNTER — Ambulatory Visit: Payer: Self-pay | Admitting: Family Medicine

## 2021-05-23 ENCOUNTER — Encounter: Payer: Self-pay | Admitting: *Deleted

## 2021-05-24 ENCOUNTER — Encounter: Payer: Self-pay | Admitting: Cardiology

## 2021-05-24 ENCOUNTER — Ambulatory Visit (INDEPENDENT_AMBULATORY_CARE_PROVIDER_SITE_OTHER): Payer: Medicaid Other | Admitting: Cardiology

## 2021-05-24 VITALS — BP 110/80 | HR 92 | Ht 64.0 in | Wt 224.2 lb

## 2021-05-24 DIAGNOSIS — R002 Palpitations: Secondary | ICD-10-CM

## 2021-05-24 DIAGNOSIS — I471 Supraventricular tachycardia: Secondary | ICD-10-CM

## 2021-05-24 DIAGNOSIS — I10 Essential (primary) hypertension: Secondary | ICD-10-CM

## 2021-05-24 NOTE — Patient Instructions (Signed)

## 2021-05-24 NOTE — Progress Notes (Signed)
? ? ? ?Clinical Summary ?Melissa Barrett is a 41 y.o.female seen today for follow up of the following medical problems.  ?  ?  ? 1. Palpitations ?- prior admission to [facility] with palpitations, found to be in SVT. This apparently occurred in setting of allergic reaction to shrimp.  ?- converted to NSR with IV dilt. ?- D-dimer was elevated at the time, CT PE was negative. Mildly low TSH but normal free T4.  ?  ?  ?- lopressor caused dizziness, changed to dil ?  ?12/2019 monitor: Rare PACs and PVCs, no significant arrhythmias ? - no recent palpitatoins, doing well on dltiazem.  ?  ?2. History of seizures ?- started on keppra ?- followed by neuro ?  ?  ?  ?3. History of peripartum CM ?- mention of LVEF as low as 15-20% in 2010 ?- Echo [facility]head 06/2009 in the media section of epic shows LVEF normalized to 50-55% ?- history tubal ligation ?  ?4. Chest pain/DOE ?-12/2019 echo LVEF 55-60%, no WMAs ?- 12/2019 nuclear stress: no ischemia ?- she does have a smoking history ?- ongonig DOE ?  ?- no chest pains, no SOB/DOE.  ?  ?  ?5. Syncope ?- no recurrent episodes.  ?  ?6. GERD ?- has tried prilosec without benefit, rolaids ?- burning pain with laying down. ?- taking ibuprofen 3 days out of the week.  ?  ?7. HTN ?- compliant with meds ?  ?  ?  ?Has 40 year old twins, a boy and a girl. 2 older kids, 4 grandkids ? ? ?Past Medical History:  ?Diagnosis Date  ? Acute systolic heart failure (HCC)   ? Alcohol abuse   ? Asthma   ? HTN (hypertension)   ? Hypertension   ? Hypokalemia   ? Pancreatitis   ? Peripartum cardiomyopathy   ? with acute systolic CHF  ? Seizures (HCC)   ? Tobacco abuse   ? Tobacco use disorder   ? ? ? ?Allergies  ?Allergen Reactions  ? Shrimp Extract Allergy Skin Test Shortness Of Breath and Rash  ? Amoxicillin   ? Amoxicillin Hives and Itching  ? Penicillins   ? Penicillins Hives and Itching  ?  Has patient had a PCN reaction causing immediate rash, facial/tongue/throat swelling, SOB or lightheadedness with  hypotension: No ?Has patient had a PCN reaction causing severe rash involving mucus membranes or skin necrosis: No ?Has patient had a PCN reaction that required hospitalization: No ?Has patient had a PCN reaction occurring within the last 10 years: No ?If all of the above answers are "NO", then may proceed with Cephalosporin use. ?  ? ? ? ?Current Outpatient Medications  ?Medication Sig Dispense Refill  ? amantadine (SYMMETREL) 100 MG capsule amantadine HCl 100 mg capsule ? TAKE ONE CAPSULE BY MOUTH TWICE DAILY    ? divalproex (DEPAKOTE) 500 MG DR tablet Take 1 tablet (500 mg total) by mouth 2 (two) times daily. 60 tablet 3  ? ergocalciferol (VITAMIN D2) 1.25 MG (50000 UT) capsule Vitamin D2 1,250 mcg (50,000 unit) capsule ? TAKE ONE CAPSULE BY MOUTH EVERY WEEK    ? Eslicarbazepine Acetate (APTIOM) 800 MG TABS Take 1 tablet by mouth every evening.    ? ibuprofen (ADVIL,MOTRIN) 800 MG tablet Take 800 mg by mouth every 6 (six) hours as needed for mild pain or moderate pain.    ? pantoprazole (PROTONIX) 40 MG tablet TAKE ONE TABLET BY MOUTH DAILY 30 tablet 6  ? perampanel (FYCOMPA) 8  MG tablet Take 8 mg by mouth at bedtime.    ? TIADYLT ER 240 MG 24 hr capsule TAKE ONE CAPSULE BY MOUTH DAILY 30 capsule 6  ? ?No current facility-administered medications for this visit.  ? ? ? ?Past Surgical History:  ?Procedure Laterality Date  ? CESAREAN SECTION    ? DILATION AND CURETTAGE OF UTERUS    ? TUBAL LIGATION    ? ? ? ?Allergies  ?Allergen Reactions  ? Shrimp Extract Allergy Skin Test Shortness Of Breath and Rash  ? Amoxicillin   ? Amoxicillin Hives and Itching  ? Penicillins   ? Penicillins Hives and Itching  ?  Has patient had a PCN reaction causing immediate rash, facial/tongue/throat swelling, SOB or lightheadedness with hypotension: No ?Has patient had a PCN reaction causing severe rash involving mucus membranes or skin necrosis: No ?Has patient had a PCN reaction that required hospitalization: No ?Has patient had a PCN  reaction occurring within the last 10 years: No ?If all of the above answers are "NO", then may proceed with Cephalosporin use. ?  ? ? ? ? ?Family History  ?Problem Relation Age of Onset  ? Hypertension Father   ? Cancer Maternal Grandmother   ? Cancer Paternal Grandmother   ? ? ? ?Social History ?Melissa Barrett reports that she has been smoking cigarettes. She started smoking about 22 years ago. She has been smoking an average of .5 packs per day. She has never used smokeless tobacco. ?Melissa Barrett reports current alcohol use. ? ? ?Review of Systems ?CONSTITUTIONAL: No weight loss, fever, chills, weakness or fatigue.  ?HEENT: Eyes: No visual loss, blurred vision, double vision or yellow sclerae.No hearing loss, sneezing, congestion, runny nose or sore throat.  ?SKIN: No rash or itching.  ?CARDIOVASCULAR: per hpi ?RESPIRATORY: No shortness of breath, cough or sputum.  ?GASTROINTESTINAL: No anorexia, nausea, vomiting or diarrhea. No abdominal pain or blood.  ?GENITOURINARY: No burning on urination, no polyuria ?NEUROLOGICAL: No headache, dizziness, syncope, paralysis, ataxia, numbness or tingling in the extremities. No change in bowel or bladder control.  ?MUSCULOSKELETAL: No muscle, back pain, joint pain or stiffness.  ?LYMPHATICS: No enlarged nodes. No history of splenectomy.  ?PSYCHIATRIC: No history of depression or anxiety.  ?ENDOCRINOLOGIC: No reports of sweating, cold or heat intolerance. No polyuria or polydipsia.  ?. ? ? ?Physical Examination ?Today's Vitals  ? 05/24/21 1432  ?BP: 110/80  ?Pulse: 92  ?SpO2: 97%  ?Weight: 224 lb 3.2 oz (101.7 kg)  ?Height: 5\' 4"  (1.626 m)  ? ?Body mass index is 38.48 kg/m?. ? ?Gen: resting comfortably, no acute distress ?HEENT: no scleral icterus, pupils equal round and reactive, no palptable cervical adenopathy,  ?CV: RRR, no m/r/g, no jvd ?Resp: Clear to auscultation bilaterally ?GI: abdomen is soft, non-tender, non-distended, normal bowel sounds, no hepatosplenomegaly ?MSK:  extremities are warm, no edema.  ?Skin: warm, no rash ?Neuro:  no focal deficits ?Psych: appropriate affect ? ? ?Diagnostic Studies ? ?12/2019 echo ?1. Left ventricular ejection fraction, by estimation, is 55 to 60%. The  ?left ventricle has normal function. The left ventricle has no regional  ?wall motion abnormalities. Left ventricular diastolic parameters were  ?normal.  ? 2. Right ventricular systolic function is normal. The right ventricular  ?size is normal. There is normal pulmonary artery systolic pressure. The  ?estimated right ventricular systolic pressure is 18.4 mmHg.  ? 3. Left atrial size was mildly dilated.  ? 4. The mitral valve is grossly normal. Trivial mitral valve  ?regurgitation.  ?  5. The aortic valve is tricuspid. Aortic valve regurgitation is not  ?visualized.  ? 6. The inferior vena cava is normal in size with greater than 50%  ?respiratory variability, suggesting right atrial pressure of 3 mmHg.  ?  ? ? ?Assessment and Plan  ?1. Palpitations/PSVT ?- recent monitor with rare ectopy ?- doing well on dilt 240mg  daily, continue ?  ?2. HTN ?- at goal, continue current meds ?  ? ? ? ? ? ? ?Antoine PocheJonathan F. Nikolas Casher, M.D. ?

## 2021-06-05 DIAGNOSIS — F411 Generalized anxiety disorder: Secondary | ICD-10-CM | POA: Diagnosis not present

## 2021-06-05 DIAGNOSIS — F331 Major depressive disorder, recurrent, moderate: Secondary | ICD-10-CM | POA: Diagnosis not present

## 2021-06-14 ENCOUNTER — Encounter: Payer: Self-pay | Admitting: Family Medicine

## 2021-06-14 ENCOUNTER — Ambulatory Visit: Payer: Self-pay | Admitting: Family Medicine

## 2021-06-14 ENCOUNTER — Ambulatory Visit: Payer: Medicaid Other | Admitting: Family Medicine

## 2021-06-14 VITALS — BP 139/84 | HR 70 | Temp 97.9°F | Ht 64.0 in | Wt 224.4 lb

## 2021-06-14 DIAGNOSIS — R569 Unspecified convulsions: Secondary | ICD-10-CM | POA: Diagnosis not present

## 2021-06-14 DIAGNOSIS — K219 Gastro-esophageal reflux disease without esophagitis: Secondary | ICD-10-CM

## 2021-06-14 DIAGNOSIS — L732 Hidradenitis suppurativa: Secondary | ICD-10-CM | POA: Diagnosis not present

## 2021-06-14 DIAGNOSIS — M1711 Unilateral primary osteoarthritis, right knee: Secondary | ICD-10-CM | POA: Insufficient documentation

## 2021-06-14 DIAGNOSIS — I10 Essential (primary) hypertension: Secondary | ICD-10-CM

## 2021-06-14 DIAGNOSIS — G4733 Obstructive sleep apnea (adult) (pediatric): Secondary | ICD-10-CM | POA: Diagnosis not present

## 2021-06-14 DIAGNOSIS — F411 Generalized anxiety disorder: Secondary | ICD-10-CM

## 2021-06-14 DIAGNOSIS — I471 Supraventricular tachycardia: Secondary | ICD-10-CM

## 2021-06-14 DIAGNOSIS — F331 Major depressive disorder, recurrent, moderate: Secondary | ICD-10-CM | POA: Diagnosis not present

## 2021-06-14 DIAGNOSIS — I509 Heart failure, unspecified: Secondary | ICD-10-CM | POA: Diagnosis not present

## 2021-06-14 DIAGNOSIS — R739 Hyperglycemia, unspecified: Secondary | ICD-10-CM

## 2021-06-14 LAB — BAYER DCA HB A1C WAIVED: HB A1C (BAYER DCA - WAIVED): 5.1 % (ref 4.8–5.6)

## 2021-06-14 NOTE — Patient Instructions (Signed)
Hidradenitis Suppurativa Hidradenitis suppurativa is a long-term (chronic) skin disease. It is similar to a severe form of acne, but it affects areas of the body where acne would be unusual, especially areas of the body where skin rubs against skin and becomes moist. These include: Underarms. Groin. Genital area. Buttocks. Upper thighs. Breasts. Hidradenitis suppurativa may start out as small lumps or pimples caused by blocked sweat glands or hair follicles. Pimples may develop into deep sores that break open (rupture) and drain pus. Over time, affected areas of skin may thicken and become scarred. This condition is rare and does not spread from person to person (non-contagious). What are the causes? The exact cause of this condition is not known. It may be related to: Female and female hormones. An overactive disease-fighting system (immune system). The immune system may over-react to blocked hair follicles or sweat glands and cause swelling and pus-filled sores. What increases the risk? You are more likely to develop this condition if you: Are female. Are 11-55 years old. Have a family history of hidradenitis suppurativa. Have a personal history of acne. Are overweight. Smoke. Take the medicine lithium. What are the signs or symptoms? The first symptoms are usually painful bumps in the skin, similar to pimples. The condition may get worse over time (progress), or it may only cause mild symptoms. If the disease progresses, symptoms may include: Skin bumps getting bigger and growing deeper into the skin. Bumps rupturing and draining pus. Itchy, infected skin. Skin getting thicker and scarred. Tunnels under the skin (fistulas) where pus drains from a bump. Pain during daily activities, such as pain during walking if your groin area is affected. Emotional problems, such as stress or depression. This condition may affect your appearance and your ability or willingness to wear certain clothes  or do certain activities. How is this diagnosed? This condition is diagnosed by a health care provider who specializes in skin diseases (dermatologist). You may be diagnosed based on: Your symptoms and medical history. A physical exam. Testing a pus sample for infection. Blood tests. How is this treated? Your treatment will depend on how severe your symptoms are. The same treatment will not work for everybody with this condition. You may need to try several treatments to find what works best for you. Treatment may include: Cleaning and bandaging (dressing) your wounds as needed. Lifestyle changes, such as new skin care routines. Taking medicines, such as: Antibiotics. Acne medicines. Medicines to reduce the activity of the immune system. A diabetes medicine (metformin). Birth control pills, for women. Steroids to reduce swelling and pain. Working with a mental health care provider, if you experience emotional distress due to this condition. If you have severe symptoms that do not get better with medicine, you may need surgery. Surgery may involve: Using a laser to clear the skin and remove hair follicles. Opening and draining deep sores. Removing the areas of skin that are diseased and scarred. Follow these instructions at home: Medicines  Take over-the-counter and prescription medicines only as told by your health care provider. If you were prescribed an antibiotic medicine, take it as told by your health care provider. Do not stop taking the antibiotic even if your condition improves. Skin care If you have open wounds, cover them with a clean dressing as told by your health care provider. Keep wounds clean by washing them gently with soap and water when you bathe. Do not shave the areas where you get hidradenitis suppurativa. Do not wear deodorant. Wear loose-fitting   clothes. Try to avoid getting overheated or sweaty. If you get sweaty or wet, change into clean, dry clothes as soon  as you can. To help relieve pain and itchiness, cover sore areas with a warm, clean washcloth (warm compress) for 5-10 minutes as often as needed. If told by your health care provider, take a bleach bath twice a week: Fill your bathtub halfway with water. Pour in  cup of unscented household bleach. Soak in the tub for 5-10 minutes. Only soak from the neck down. Avoid water on your face and hair. Shower to rinse off the bleach from your skin. General instructions Learn as much as you can about your disease so that you have an active role in your treatment. Work closely with your health care provider to find treatments that work for you. If you are overweight, work with your health care provider to lose weight as recommended. Do not use any products that contain nicotine or tobacco, such as cigarettes and e-cigarettes. If you need help quitting, ask your health care provider. If you struggle with living with this condition, talk with your health care provider or work with a mental health care provider as recommended. Keep all follow-up visits as told by your health care provider. This is important. Where to find more information Hidradenitis Suppurativa Foundation[employer] Inc.: https://www.hs-foundation.org/ American Academy of Dermatology: https://www.aad.org Contact a health care provider if you have: A flare-up of hidradenitis suppurativa. A fever or chills. Trouble controlling your symptoms at home. Trouble doing your daily activities because of your symptoms. Trouble dealing with emotional problems related to your condition. Summary Hidradenitis suppurativa is a long-term (chronic) skin disease. It is similar to a severe form of acne, but it affects areas of the body where acne would be unusual. The first symptoms are usually painful bumps in the skin, similar to pimples. The condition may only cause mild symptoms, or it may get worse over time (progress). If you have open wounds, cover them  with a clean dressing as told by your health care provider. Keep wounds clean by washing them gently with soap and water when you bathe. Besides skin care, treatment may include medicines, laser treatment, and surgery. This information is not intended to replace advice given to you by your health care provider. Make sure you discuss any questions you have with your health care provider. Document Revised: 10/25/2019 Document Reviewed: 10/25/2019 Elsevier Patient Education  2023 [employer]  

## 2021-06-14 NOTE — Progress Notes (Signed)
New Patient Office Visit  Subjective    Patient ID: Melissa Barrett, female    DOB: [birth year]  Age: 41 y.o. MRN: [id number]  CC:  Chief Complaint  Patient presents with   New Patient (Initial Visit)    HPI Melissa Barrett presents to establish care. [facility]. She is establish with cardiology for a history of SVT, palpitations, and CHF. Her last visit with cardiology was last month. Reports visit went well. She is doing well on diltiazem. She is established with neurology for seizures. She is currently on depakote, folic acid, aptiom, and fycompa.  Neurology has planned for an EEG in the next few weeks and an MRI brain. They have also referred her to psychiatry and sleep clinic. She does have a history of OSA. She does not use CPAP currently. She has established with psychiatry for depression and anxiety. They recently increased her zoloft. She takes protronix for GERD with good relief. She take ibuprofen a few times a week for arthritis in her right knee. She sees ortho for this.   She would like to talk about knots that she gets in her bilateral axilla. This has been an issue since she was younger. She does shave and wonders if this is the cause. She does not currently have any signs of infection but reports that these areas do get infected intermittently.   She is fasting today.      06/14/2021   11:26 AM  Depression screen PHQ 2/9  Decreased Interest 1  Down, Depressed, Hopeless 2  PHQ - 2 Score 3  Altered sleeping 2  Tired, decreased energy 3  Change in appetite 1  Feeling bad or failure about yourself  1  Trouble concentrating 1  Moving slowly or fidgety/restless 0  Suicidal thoughts 0  PHQ-9 Score 11  Difficult doing work/chores Somewhat difficult      06/14/2021   11:27 AM  GAD 7 : Generalized Anxiety Score  Nervous, Anxious, on Edge 1  Control/stop worrying 1  Worry too much - different things 1  Trouble  relaxing 1  Restless 1  Easily annoyed or irritable 1  Afraid - awful might happen 1  Total GAD 7 Score 7  Anxiety Difficulty Somewhat difficult     Outpatient Encounter Medications as of 06/14/2021  Medication Sig   divalproex (DEPAKOTE) 500 MG DR tablet Take 1 tablet (500 mg total) by mouth 2 (two) times daily.   ergocalciferol (VITAMIN D2) 1.25 MG (50000 UT) capsule Vitamin D2 1,250 mcg (50,000 unit) capsule  TAKE ONE CAPSULE BY MOUTH EVERY WEEK   Eslicarbazepine Acetate (APTIOM) 800 MG TABS Take 1.5 tablets by mouth every evening.   folic acid (FOLVITE) 1 MG tablet Take 4 tablets by mouth daily.   FYCOMPA 4 MG TABS Take 4 mg by mouth daily.   ibuprofen (ADVIL,MOTRIN) 800 MG tablet Take 800 mg by mouth every 6 (six) hours as needed for mild pain or moderate pain.   Midazolam 5 MG/0.1ML SOLN Use 1 spray in 1 nostril as needed for seizure lasting longer than 2 minutes or for seizure cluster.Call 911 if used.   pantoprazole (PROTONIX) 40 MG tablet TAKE ONE TABLET BY MOUTH DAILY   sertraline (ZOLOFT) 100 MG tablet Take 100 mg by mouth daily.   TIADYLT ER 240 MG 24 hr capsule TAKE ONE CAPSULE BY MOUTH DAILY   [DISCONTINUED] perampanel (FYCOMPA) 8 MG tablet Take 4 mg by  mouth at bedtime.   [DISCONTINUED] sertraline (ZOLOFT) 50 MG tablet Take 1/2 tab a day for a week, then 1 tab a day   [DISCONTINUED] amantadine (SYMMETREL) 100 MG capsule amantadine HCl 100 mg capsule  TAKE ONE CAPSULE BY MOUTH TWICE DAILY   No facility-administered encounter medications on file as of 06/14/2021.    Past Medical History:  Diagnosis Date   Acute systolic heart failure (HCC)    Alcohol abuse    Anxiety    Arthritis    Asthma    Depression    GERD (gastroesophageal reflux disease)    HTN (hypertension)    Hypertension    Hypokalemia    Pancreatitis    Peripartum cardiomyopathy    with acute systolic CHF   Seizures ([place])    Tobacco abuse    Tobacco use disorder     Past Surgical History:   Procedure Laterality Date   CESAREAN SECTION     DILATION AND CURETTAGE OF UTERUS     TUBAL LIGATION      Family History  Problem Relation Age of Onset   Arthritis Mother    Alcohol abuse Mother    Hyperlipidemia Father    Heart disease Father    Alcohol abuse Father    Hypertension Father    Diabetes Maternal Grandmother    Cancer Maternal Grandmother        breast cancer   Cancer Paternal Grandmother        breast cancer    Social History   Socioeconomic History   Marital status: Single    Spouse name: Not on file   Number of children: 4   Years of education: 10   Highest education level: 10th grade  Occupational History   Not on file  Tobacco Use   Smoking status: Every Day    Packs/day: 1.00    Years: 18.00    Pack years: 18.00    Types: Cigarettes    Start date: 09/25/1998   Smokeless tobacco: Never  Vaping Use   Vaping Use: Never used  Substance and Sexual Activity   Alcohol use: Not Currently   Drug use: No   Sexual activity: Yes    Birth control/protection: Surgical    Comment: BTL  Other Topics Concern   Not on file  Social History Narrative   ** Merged History Encounter **       Social Determinants of Health   Financial Resource Strain: Not on file  Food Insecurity: Not on file  Transportation Needs: Not on file  Physical Activity: Not on file  Stress: Not on file  Social Connections: Not on file  Intimate Partner Violence: Not on file    ROS As per HPI.     Objective    BP 139/84   Pulse 70   Temp 97.9 F (36.6 C) (Temporal)   Ht 5' 4"  (1.626 m)   Wt 224 lb 6 oz (101.8 kg)   SpO2 99%   BMI 38.51 kg/m   Physical Exam Vitals and nursing note reviewed.  Constitutional:      General: She is not in acute distress.    Appearance: She is not ill-appearing, toxic-appearing or diaphoretic.  Eyes:     General: No scleral icterus.       Right eye: No discharge.        Left eye: No discharge.     Pupils: Pupils are equal, round,  and reactive to light.  Neck:     Vascular:  No carotid bruit.  Cardiovascular:     Rate and Rhythm: Normal rate and regular rhythm.     Heart sounds: Normal heart sounds. No murmur heard. Pulmonary:     Effort: Pulmonary effort is normal. No respiratory distress.     Breath sounds: Normal breath sounds.  Abdominal:     General: Bowel sounds are normal. There is no distension.     Palpations: Abdomen is soft.     Tenderness: There is no abdominal tenderness. There is no guarding or rebound.  Musculoskeletal:        General: No swelling.     Cervical back: Neck supple.     Right lower leg: No edema.     Left lower leg: No edema.  Lymphadenopathy:     Cervical: No cervical adenopathy.  Skin:    General: Skin is warm and dry.     Comments: Hidradenitis suppurativa to bilateral axilla. No exudate.   Neurological:     Mental Status: She is alert and oriented to person, place, and time.     Gait: Gait normal.  Psychiatric:        Mood and Affect: Mood normal.        Behavior: Behavior normal.    Last CBC Lab Results  Component Value Date   WBC 9.9 11/03/2017   HGB 12.5 11/03/2017   HCT 35.6 (L) 11/03/2017   MCV 88.8 11/03/2017   MCH 31.2 11/03/2017   RDW 13.2 11/03/2017   PLT 245 50/35/4656   Last metabolic panel Lab Results  Component Value Date   GLUCOSE 121 (H) 11/03/2017   NA 139 11/03/2017   K 3.2 (L) 11/03/2017   CL 104 11/03/2017   CO2 29 11/03/2017   BUN 11 11/03/2017   CREATININE 0.94 11/03/2017   GFRNONAA >60 11/03/2017   CALCIUM 8.8 (L) 11/03/2017   PROT 7.2 11/03/2017   ALBUMIN 3.8 11/03/2017   BILITOT 1.1 11/03/2017   ALKPHOS 77 11/03/2017   AST 23 11/03/2017   ALT 9 11/03/2017   ANIONGAP 6 11/03/2017   Last lipids No results found for: CHOL, HDL, LDLCALC, LDLDIRECT, TRIG, CHOLHDL Last hemoglobin A1c No results found for: HGBA1C        Assessment & Plan:   Melissa Barrett was seen today for new patient (initial visit).  Diagnoses and all  orders for this visit:  Primary hypertension Well controlled on current regimen. On diltiazem. Fasting labs as below.  -     CMP14+EGFR -     CBC with Differential/Platelet -     Lipid panel -     Thyroid Panel With TSH  PSVT (paroxysmal supraventricular tachycardia) (HCC) Chronic congestive heart failure, unspecified heart failure type [facility]) Managed by cardiology. Stable. On diltiazem.  -     CMP14+EGFR -     CBC with Differential/Platelet -     Lipid panel  Seizure [facility]) Managed by neurology. Has upcoming EEG and MRI. On depakote, aptiom, folic acide, and fycompa.  -     CMP14+EGFR -     CBC with Differential/Platelet  Morbid obesity (HCC) Diet and exercise. Labs pending.  -     CMP14+EGFR -     CBC with Differential/Platelet -     Lipid panel -     Thyroid Panel With TSH -     Bayer DCA Hb A1c Waived  Obstructive sleep apnea Has been referred to sleep clinic.  Moderate episode of recurrent major depressive disorder (HCC) GAD (generalized anxiety disorder) Uncontrolled. Denies  SI. Managed by psych, recently increased zoloft dosage.  Primary osteoarthritis of right knee Managed by ortho. Ibuprofen prn.   Gastroesophageal reflux disease without esophagitis Well controlled on current regimen. On protonix.   Hidradenitis suppurativa Referral to derm. Handout given.  -     Ambulatory referral to Dermatology  Blood glucose elevated Will check a1c today.  -     Bayer DCA Hb A1c Waived  Return in about 3 months (around 09/14/2021) for CPE with pap.   The patient indicates understanding of these issues and agrees with the plan.  Gwenlyn Perking, FNP

## 2021-06-15 LAB — CBC WITH DIFFERENTIAL/PLATELET
Basophils Absolute: 0 10*3/uL (ref 0.0–0.2)
Basos: 0 %
EOS (ABSOLUTE): 0.1 10*3/uL (ref 0.0–0.4)
Eos: 1 %
Hematocrit: 39.2 % (ref 34.0–46.6)
Hemoglobin: 13.4 g/dL (ref 11.1–15.9)
Immature Grans (Abs): 0 10*3/uL (ref 0.0–0.1)
Immature Granulocytes: 0 %
Lymphocytes Absolute: 2.6 10*3/uL (ref 0.7–3.1)
Lymphs: 31 %
MCH: 31.4 pg (ref 26.6–33.0)
MCHC: 34.2 g/dL (ref 31.5–35.7)
MCV: 92 fL (ref 79–97)
Monocytes Absolute: 0.7 10*3/uL (ref 0.1–0.9)
Monocytes: 9 %
Neutrophils Absolute: 4.8 10*3/uL (ref 1.4–7.0)
Neutrophils: 59 %
Platelets: 242 10*3/uL (ref 150–450)
RBC: 4.27 x10E6/uL (ref 3.77–5.28)
RDW: 14.8 % (ref 11.7–15.4)
WBC: 8.3 10*3/uL (ref 3.4–10.8)

## 2021-06-15 LAB — CMP14+EGFR
ALT: 6 IU/L (ref 0–32)
AST: 13 IU/L (ref 0–40)
Albumin/Globulin Ratio: 1.7 (ref 1.2–2.2)
Albumin: 4.4 g/dL (ref 3.8–4.8)
Alkaline Phosphatase: 75 IU/L (ref 44–121)
BUN/Creatinine Ratio: 8 — ABNORMAL LOW (ref 9–23)
BUN: 6 mg/dL (ref 6–20)
Bilirubin Total: 0.3 mg/dL (ref 0.0–1.2)
CO2: 27 mmol/L (ref 20–29)
Calcium: 9.5 mg/dL (ref 8.7–10.2)
Chloride: 103 mmol/L (ref 96–106)
Creatinine, Ser: 0.78 mg/dL (ref 0.57–1.00)
Globulin, Total: 2.6 g/dL (ref 1.5–4.5)
Glucose: 76 mg/dL (ref 70–99)
Potassium: 4.1 mmol/L (ref 3.5–5.2)
Sodium: 141 mmol/L (ref 134–144)
Total Protein: 7 g/dL (ref 6.0–8.5)
eGFR: 99 mL/min/{1.73_m2} (ref >59)

## 2021-06-15 LAB — LIPID PANEL
Chol/HDL Ratio: 2.7 ratio (ref 0.0–4.4)
Cholesterol, Total: 109 mg/dL (ref 100–199)
HDL: 40 mg/dL (ref >39)
LDL Chol Calc (NIH): 52 mg/dL (ref 0–99)
Triglycerides: 83 mg/dL (ref 0–149)
VLDL Cholesterol Cal: 17 mg/dL (ref 5–40)

## 2021-06-15 LAB — THYROID PANEL WITH TSH
Free Thyroxine Index: 1.7 (ref 1.2–4.9)
T3 Uptake Ratio: 24 % (ref 24–39)
T4, Total: 6.9 ug/dL (ref 4.5–12.0)
TSH: 2.75 u[IU]/mL (ref 0.450–4.500)

## 2021-06-24 DIAGNOSIS — G40219 Localization-related (focal) (partial) symptomatic epilepsy and epileptic syndromes with complex partial seizures, intractable, without status epilepticus: Secondary | ICD-10-CM | POA: Diagnosis not present

## 2021-06-24 DIAGNOSIS — R9401 Abnormal electroencephalogram [EEG]: Secondary | ICD-10-CM | POA: Diagnosis not present

## 2021-06-24 DIAGNOSIS — R569 Unspecified convulsions: Secondary | ICD-10-CM | POA: Diagnosis not present

## 2021-06-25 ENCOUNTER — Other Ambulatory Visit: Payer: Self-pay | Admitting: Family Medicine

## 2021-06-25 DIAGNOSIS — R569 Unspecified convulsions: Secondary | ICD-10-CM | POA: Diagnosis not present

## 2021-06-25 DIAGNOSIS — R9401 Abnormal electroencephalogram [EEG]: Secondary | ICD-10-CM | POA: Diagnosis not present

## 2021-06-25 DIAGNOSIS — Z1231 Encounter for screening mammogram for malignant neoplasm of breast: Secondary | ICD-10-CM

## 2021-06-25 DIAGNOSIS — G40219 Localization-related (focal) (partial) symptomatic epilepsy and epileptic syndromes with complex partial seizures, intractable, without status epilepticus: Secondary | ICD-10-CM | POA: Diagnosis not present

## 2021-06-26 DIAGNOSIS — G40219 Localization-related (focal) (partial) symptomatic epilepsy and epileptic syndromes with complex partial seizures, intractable, without status epilepticus: Secondary | ICD-10-CM | POA: Diagnosis not present

## 2021-06-26 DIAGNOSIS — R569 Unspecified convulsions: Secondary | ICD-10-CM | POA: Diagnosis not present

## 2021-06-26 DIAGNOSIS — R9401 Abnormal electroencephalogram [EEG]: Secondary | ICD-10-CM | POA: Diagnosis not present

## 2021-06-27 DIAGNOSIS — R569 Unspecified convulsions: Secondary | ICD-10-CM | POA: Diagnosis not present

## 2021-06-27 DIAGNOSIS — R9401 Abnormal electroencephalogram [EEG]: Secondary | ICD-10-CM | POA: Diagnosis not present

## 2021-06-27 DIAGNOSIS — G40219 Localization-related (focal) (partial) symptomatic epilepsy and epileptic syndromes with complex partial seizures, intractable, without status epilepticus: Secondary | ICD-10-CM | POA: Diagnosis not present

## 2021-07-08 DIAGNOSIS — M1711 Unilateral primary osteoarthritis, right knee: Secondary | ICD-10-CM | POA: Diagnosis not present

## 2021-07-10 DIAGNOSIS — G40019 Localization-related (focal) (partial) idiopathic epilepsy and epileptic syndromes with seizures of localized onset, intractable, without status epilepticus: Secondary | ICD-10-CM | POA: Diagnosis not present

## 2021-07-12 ENCOUNTER — Encounter: Payer: Medicaid Other | Admitting: Family Medicine

## 2021-07-12 DIAGNOSIS — Z91199 Patient's noncompliance with other medical treatment and regimen due to unspecified reason: Secondary | ICD-10-CM

## 2021-07-12 NOTE — Progress Notes (Signed)
Called patient at 8:20, 8:28, 8:50. No answer, left message each time. No return call. Patient needs to reschedule

## 2021-07-19 DIAGNOSIS — Z720 Tobacco use: Secondary | ICD-10-CM | POA: Diagnosis not present

## 2021-07-19 DIAGNOSIS — R0602 Shortness of breath: Secondary | ICD-10-CM | POA: Diagnosis not present

## 2021-07-19 DIAGNOSIS — J4521 Mild intermittent asthma with (acute) exacerbation: Secondary | ICD-10-CM | POA: Diagnosis not present

## 2021-08-02 DIAGNOSIS — F411 Generalized anxiety disorder: Secondary | ICD-10-CM | POA: Diagnosis not present

## 2021-08-02 DIAGNOSIS — F331 Major depressive disorder, recurrent, moderate: Secondary | ICD-10-CM | POA: Diagnosis not present

## 2021-08-13 DIAGNOSIS — G4730 Sleep apnea, unspecified: Secondary | ICD-10-CM | POA: Diagnosis not present

## 2021-08-20 DIAGNOSIS — G40219 Localization-related (focal) (partial) symptomatic epilepsy and epileptic syndromes with complex partial seizures, intractable, without status epilepticus: Secondary | ICD-10-CM | POA: Diagnosis not present

## 2021-08-20 DIAGNOSIS — G40919 Epilepsy, unspecified, intractable, without status epilepticus: Secondary | ICD-10-CM | POA: Diagnosis not present

## 2021-08-20 DIAGNOSIS — Z79899 Other long term (current) drug therapy: Secondary | ICD-10-CM | POA: Diagnosis not present

## 2021-08-21 DIAGNOSIS — Z79899 Other long term (current) drug therapy: Secondary | ICD-10-CM | POA: Diagnosis not present

## 2021-08-21 DIAGNOSIS — Z5181 Encounter for therapeutic drug level monitoring: Secondary | ICD-10-CM | POA: Diagnosis not present

## 2021-10-02 ENCOUNTER — Encounter: Payer: Medicaid Other | Admitting: Family Medicine

## 2021-10-02 ENCOUNTER — Ambulatory Visit
Admission: RE | Admit: 2021-10-02 | Discharge: 2021-10-02 | Disposition: A | Discharge status: Home / Self Care | Payer: Medicaid Other | Source: Ambulatory Visit | Attending: Family Medicine | Admitting: Family Medicine

## 2021-10-02 DIAGNOSIS — Z1231 Encounter for screening mammogram for malignant neoplasm of breast: Secondary | ICD-10-CM

## 2021-10-03 ENCOUNTER — Encounter: Payer: Self-pay | Admitting: Family Medicine

## 2021-10-03 DIAGNOSIS — R059 Cough, unspecified: Secondary | ICD-10-CM | POA: Diagnosis not present

## 2021-10-03 DIAGNOSIS — Z20822 Contact with and (suspected) exposure to covid-19: Secondary | ICD-10-CM | POA: Diagnosis not present

## 2021-10-03 DIAGNOSIS — J069 Acute upper respiratory infection, unspecified: Secondary | ICD-10-CM | POA: Diagnosis not present

## 2021-10-03 DIAGNOSIS — Z720 Tobacco use: Secondary | ICD-10-CM | POA: Diagnosis not present

## 2021-10-03 DIAGNOSIS — R042 Hemoptysis: Secondary | ICD-10-CM | POA: Diagnosis not present

## 2021-10-04 ENCOUNTER — Other Ambulatory Visit: Payer: Self-pay | Admitting: Family Medicine

## 2021-10-04 DIAGNOSIS — R928 Other abnormal and inconclusive findings on diagnostic imaging of breast: Secondary | ICD-10-CM

## 2021-10-08 DIAGNOSIS — M7050 Other bursitis of knee, unspecified knee: Secondary | ICD-10-CM | POA: Diagnosis not present

## 2021-10-08 DIAGNOSIS — M1711 Unilateral primary osteoarthritis, right knee: Secondary | ICD-10-CM | POA: Diagnosis not present

## 2021-10-14 DIAGNOSIS — G40919 Epilepsy, unspecified, intractable, without status epilepticus: Secondary | ICD-10-CM | POA: Diagnosis not present

## 2021-10-16 DIAGNOSIS — G40219 Localization-related (focal) (partial) symptomatic epilepsy and epileptic syndromes with complex partial seizures, intractable, without status epilepticus: Secondary | ICD-10-CM | POA: Diagnosis not present

## 2021-10-16 DIAGNOSIS — G40119 Localization-related (focal) (partial) symptomatic epilepsy and epileptic syndromes with simple partial seizures, intractable, without status epilepticus: Secondary | ICD-10-CM | POA: Diagnosis not present

## 2021-10-21 ENCOUNTER — Ambulatory Visit
Admission: RE | Admit: 2021-10-21 | Discharge: 2021-10-21 | Disposition: A | Discharge status: Home / Self Care | Payer: Medicaid Other | Source: Ambulatory Visit | Attending: Family Medicine | Admitting: Family Medicine

## 2021-10-21 DIAGNOSIS — R928 Other abnormal and inconclusive findings on diagnostic imaging of breast: Secondary | ICD-10-CM

## 2021-10-21 DIAGNOSIS — N6489 Other specified disorders of breast: Secondary | ICD-10-CM | POA: Diagnosis not present

## 2021-10-29 DIAGNOSIS — F411 Generalized anxiety disorder: Secondary | ICD-10-CM | POA: Diagnosis not present

## 2021-10-29 DIAGNOSIS — F331 Major depressive disorder, recurrent, moderate: Secondary | ICD-10-CM | POA: Diagnosis not present

## 2021-11-27 DIAGNOSIS — F411 Generalized anxiety disorder: Secondary | ICD-10-CM | POA: Diagnosis not present

## 2021-11-27 DIAGNOSIS — F331 Major depressive disorder, recurrent, moderate: Secondary | ICD-10-CM | POA: Diagnosis not present

## 2021-12-19 ENCOUNTER — Ambulatory Visit (INDEPENDENT_AMBULATORY_CARE_PROVIDER_SITE_OTHER): Payer: Medicaid Other | Admitting: Family Medicine

## 2021-12-19 ENCOUNTER — Encounter: Payer: Self-pay | Admitting: Family Medicine

## 2021-12-19 VITALS — BP 138/87 | HR 71 | Temp 98.1°F | Ht 64.0 in | Wt 208.5 lb

## 2021-12-19 DIAGNOSIS — R569 Unspecified convulsions: Secondary | ICD-10-CM

## 2021-12-19 DIAGNOSIS — Z23 Encounter for immunization: Secondary | ICD-10-CM

## 2021-12-19 DIAGNOSIS — R739 Hyperglycemia, unspecified: Secondary | ICD-10-CM | POA: Diagnosis not present

## 2021-12-19 DIAGNOSIS — K219 Gastro-esophageal reflux disease without esophagitis: Secondary | ICD-10-CM

## 2021-12-19 DIAGNOSIS — F411 Generalized anxiety disorder: Secondary | ICD-10-CM

## 2021-12-19 DIAGNOSIS — Z0000 Encounter for general adult medical examination without abnormal findings: Secondary | ICD-10-CM | POA: Diagnosis not present

## 2021-12-19 DIAGNOSIS — I471 Supraventricular tachycardia, unspecified: Secondary | ICD-10-CM | POA: Diagnosis not present

## 2021-12-19 DIAGNOSIS — I10 Essential (primary) hypertension: Secondary | ICD-10-CM

## 2021-12-19 DIAGNOSIS — Z0001 Encounter for general adult medical examination with abnormal findings: Secondary | ICD-10-CM

## 2021-12-19 DIAGNOSIS — G4733 Obstructive sleep apnea (adult) (pediatric): Secondary | ICD-10-CM

## 2021-12-19 DIAGNOSIS — F331 Major depressive disorder, recurrent, moderate: Secondary | ICD-10-CM

## 2021-12-19 DIAGNOSIS — Z720 Tobacco use: Secondary | ICD-10-CM | POA: Diagnosis not present

## 2021-12-19 LAB — BAYER DCA HB A1C WAIVED: HB A1C (BAYER DCA - WAIVED): 5.3 % (ref 4.8–5.6)

## 2021-12-19 NOTE — Addendum Note (Signed)
Addended by: Hessie Diener on: 12/19/2021 03:55 PM   Modules accepted: Orders

## 2021-12-19 NOTE — Progress Notes (Signed)
Complete physical exam  Patient: Melissa Barrett   DOB: 1980/06/27   41 y.o. Female  MRN: [id number]  Subjective:    Chief Complaint  Patient presents with   Annual Exam    Tannie Koskela Krupp is a 41 y.o. female who presents today for a complete physical exam. She reports consuming a general diet.  She walks daily.  She generally feels fairly well. She reports sleeping fairly well. She does not have additional problems to discuss today.   She has been following up with neurology. They recently had to change her medications as she had a breakthrough seizure.   She continues to follow up with cardiology and reports that this is going well.      12/19/2021   10:23 AM 06/14/2021   11:26 AM  Depression screen PHQ 2/9  Decreased Interest 1 1  Down, Depressed, Hopeless 1 2  PHQ - 2 Score 2 3  Altered sleeping 1 2  Tired, decreased energy 1 3  Change in appetite 0 1  Feeling bad or failure about yourself  0 1  Trouble concentrating 0 1  Moving slowly or fidgety/restless 0 0  Suicidal thoughts 0 0  PHQ-9 Score 4 11  Difficult doing work/chores Somewhat difficult Somewhat difficult      12/19/2021   10:24 AM 06/14/2021   11:27 AM  GAD 7 : Generalized Anxiety Score  Nervous, Anxious, on Edge 1 1  Control/stop worrying 1 1  Worry too much - different things 1 1  Trouble relaxing 1 1  Restless 1 1  Easily annoyed or irritable 1 1  Afraid - awful might happen 1 1  Total GAD 7 Score 7 7  Anxiety Difficulty Somewhat difficult Somewhat difficult     Most recent fall risk assessment:    06/14/2021   11:16 AM  Fall Risk   Falls in the past year? 0     Most recent depression screenings:    06/14/2021   11:26 AM  PHQ 2/9 Scores  PHQ - 2 Score 3  PHQ- 9 Score 11    Vision:Not within last year  and Dental: No current dental problems and No regular dental care   Past Medical History:  Diagnosis Date   Acute systolic heart failure (HCC)    Alcohol abuse    Anxiety    Arthritis     Asthma    Depression    GERD (gastroesophageal reflux disease)    HTN (hypertension)    Hypertension    Hypokalemia    Pancreatitis    Peripartum cardiomyopathy    with acute systolic CHF   Seizures ([place])    Tobacco abuse    Tobacco use disorder       Patient Care Team: Gwenlyn Perking, FNP as PCP - General (Family Medicine) Harl Bowie, Alphonse Guild, MD as PCP - Cardiology (Cardiology) Raiford Simmonds., PA-C   Outpatient Medications Prior to Visit  Medication Sig   Albuterol Sulfate (PROAIR RESPICLICK) 165 (90 Base) MCG/ACT AEPB Inhale into the lungs.   busPIRone (BUSPAR) 10 MG tablet Take 1 tablet by mouth 2 (two) times daily.   Cenobamate 200 MG TABS Take 1 tablet by mouth daily.   divalproex (DEPAKOTE) 500 MG DR tablet Take 1 tablet (500 mg total) by mouth 2 (two) times daily.   ergocalciferol (VITAMIN D2) 1.25 MG (50000 UT) capsule Vitamin D2 1,250 mcg (50,000 unit) capsule  TAKE ONE CAPSULE BY MOUTH EVERY WEEK   Eslicarbazepine Acetate (APTIOM)  800 MG TABS Take 1.5 tablets by mouth every evening.   folic acid (FOLVITE) 1 MG tablet Take 4 tablets by mouth daily.   ibuprofen (ADVIL,MOTRIN) 800 MG tablet Take 800 mg by mouth every 6 (six) hours as needed for mild pain or moderate pain.   Midazolam 5 MG/0.1ML SOLN Use 1 spray in 1 nostril as needed for seizure lasting longer than 2 minutes or for seizure cluster.Call 911 if used.   pantoprazole (PROTONIX) 40 MG tablet TAKE ONE TABLET BY MOUTH DAILY   sertraline (ZOLOFT) 100 MG tablet Take 100 mg by mouth daily.   TIADYLT ER 240 MG 24 hr capsule TAKE ONE CAPSULE BY MOUTH DAILY   [DISCONTINUED] FYCOMPA 4 MG TABS Take 4 mg by mouth daily.   No facility-administered medications prior to visit.    ROS Negative unless specially indicated above in HPI.     Objective:     BP 138/87   Pulse 71   Temp 98.1 F (36.7 C) (Temporal)   Ht 5' 4" (1.626 m)   Wt 208 lb 8 oz (94.6 kg)   SpO2 99%   BMI 35.79 kg/m  BP Readings  from Last 3 Encounters:  12/19/21 138/87  06/14/21 139/84  05/24/21 110/80   Wt Readings from Last 3 Encounters:  12/19/21 208 lb 8 oz (94.6 kg)  06/14/21 224 lb 6 oz (101.8 kg)  05/24/21 224 lb 3.2 oz (101.7 kg)     Physical Exam Vitals and nursing note reviewed.  Constitutional:      General: She is not in acute distress.    Appearance: Normal appearance. She is not ill-appearing, toxic-appearing or diaphoretic.  HENT:     Head: Normocephalic and atraumatic.     Right Ear: Tympanic membrane, ear canal and external ear normal.     Left Ear: Tympanic membrane, ear canal and external ear normal.     Nose: Nose normal.     Mouth/Throat:     Mouth: Mucous membranes are dry.     Pharynx: Oropharynx is clear.  Eyes:     Extraocular Movements: Extraocular movements intact.     Conjunctiva/sclera: Conjunctivae normal.     Pupils: Pupils are equal, round, and reactive to light.  Neck:     Thyroid: No thyroid mass, thyromegaly or thyroid tenderness.  Cardiovascular:     Rate and Rhythm: Normal rate and regular rhythm.     Pulses: Normal pulses.     Heart sounds: Normal heart sounds. No murmur heard.    No friction rub. No gallop.  Pulmonary:     Effort: Pulmonary effort is normal.     Breath sounds: Normal breath sounds.  Abdominal:     General: Bowel sounds are normal. There is no distension.     Palpations: Abdomen is soft. There is no mass.     Tenderness: There is no abdominal tenderness. There is no guarding.  Musculoskeletal:     Cervical back: Normal range of motion. No tenderness.     Right lower leg: No edema.     Left lower leg: No edema.  Skin:    General: Skin is warm and dry.     Capillary Refill: Capillary refill takes less than 2 seconds.     Findings: No lesion or rash.  Neurological:     General: No focal deficit present.     Mental Status: She is alert and oriented to person, place, and time.     Cranial Nerves: No cranial nerve deficit.  Motor: No  weakness.     Gait: Gait normal.  Psychiatric:        Mood and Affect: Mood normal.        Behavior: Behavior normal.        Thought Content: Thought content normal.        Judgment: Judgment normal.      No results found for any visits on 12/19/21.     Assessment & Plan:    Routine Health Maintenance and Physical Exam  Ader was seen today for annual exam.  Diagnoses and all orders for this visit:  Routine general medical examination at a health care facility Fasting labs pending.  -     CBC with Differential/Platelet -     CMP14+EGFR -     Lipid panel -     TSH  Primary hypertension Well controlled on current regimen. On Tiadylt.  -     CBC with Differential/Platelet -     CMP14+EGFR -     Lipid panel -     TSH  PSVT (paroxysmal supraventricular tachycardia) Managed by cardiology. On Tiadylt.  Moderate episode of recurrent major depressive disorder (HCC) GAD (generalized anxiety disorder) Managed by [facility]. On zoloft and buspar.   Morbid obesity (HCC) BMI 35 with HTN and depression.  -     CBC with Differential/Platelet -     CMP14+EGFR -     Lipid panel -     Bayer DCA Hb A1c Waived  Seizures ([place]) Managed by neurology.   Gastroesophageal reflux disease without esophagitis Well controlled on current regimen. On protonix.  -     CBC with Differential/Platelet -     CMP14+EGFR  Obstructive sleep apnea Managed by sleep medicine.   Tobacco abuse Counseling provided.   Need for vaccination Tdap today.   Immunization History  Administered Date(s) Administered   Influenza,inj,Quad PF,6+ Mos 11/04/2017   Pneumococcal Polysaccharide-23 11/04/2017   Td (Adult),5 Lf Tetanus Toxid, Preservative Free 06/13/2002    Health Maintenance  Topic Date Due   Hepatitis C Screening  Never done   DTaP/Tdap/Td (1 - Tdap) 06/14/2002   PAP SMEAR-Modifier  Never done   INFLUENZA VACCINE  08/13/2021   COVID-19 Vaccine (1) 07/01/2022 (Originally 09/25/1986)   HIV  Screening  Completed   HPV VACCINES  Aged Out    Discussed health benefits of physical activity, and encouraged her to engage in regular exercise appropriate for her age and condition.  Problem List Items Addressed This Visit       Cardiovascular and Mediastinum   HTN (hypertension) (Chronic)   Relevant Orders   CBC with Differential/Platelet   CMP14+EGFR   Lipid panel   TSH   PSVT (paroxysmal supraventricular tachycardia) (Chronic)     Respiratory   Obstructive sleep apnea     Digestive   Gastroesophageal reflux disease without esophagitis   Relevant Orders   CBC with Differential/Platelet   CMP14+EGFR     Other   Seizures (HCC) (Chronic)   Relevant Medications   Cenobamate 200 MG TABS   Moderate episode of recurrent major depressive disorder (HCC) (Chronic)   Relevant Medications   busPIRone (BUSPAR) 10 MG tablet   Morbid obesity (HCC) (Chronic)   Relevant Orders   CBC with Differential/Platelet   CMP14+EGFR   Lipid panel   Bayer DCA Hb A1c Waived   Tobacco abuse   GAD (generalized anxiety disorder)   Relevant Medications   busPIRone (BUSPAR) 10 MG tablet   Other Visit Diagnoses  Routine general medical examination at a health care facility    -  Primary   Relevant Orders   CBC with Differential/Platelet   CMP14+EGFR   Lipid panel   TSH   Need for vaccination          Return in 1 year (on 12/20/2022) for CPE with pap.   The patient indicates understanding of these issues and agrees with the plan.   Gwenlyn Perking, FNP

## 2021-12-19 NOTE — Patient Instructions (Addendum)
Smoking Tobacco Information, Adult Smoking tobacco can be harmful to your health. Tobacco contains a toxic colorless chemical called nicotine. Nicotine causes changes in your brain that make you want more and more. This is called addiction. This can make it hard to stop smoking once you start. Tobacco also has other toxic chemicals that can hurt your body and raise your risk of many cancers. Menthol or "lite" tobacco or cigarette brands are not safer than regular brands. How can smoking tobacco affect me? Smoking tobacco puts you at risk for: Cancer. Smoking is most commonly associated with lung cancer, but can also lead to cancer in other parts of the body. Chronic obstructive pulmonary disease (COPD). This is a long-term lung condition that makes it hard to breathe. It also gets worse over time. High blood pressure (hypertension), heart disease, stroke, heart attack, and lung infections, such as pneumonia. Cataracts. This is when the lenses in the eyes become clouded. Digestive problems. This may include peptic ulcers, heartburn, and gastroesophageal reflux disease (GERD). Oral health problems, such as gum disease, mouth sores, and tooth loss. Loss of taste and smell. Smoking also affects how you look and smell. Smoking may cause: Wrinkles. Yellow or stained teeth, fingers, and fingernails. Bad breath. Bad-smelling clothes and hair. Smoking tobacco can also affect your social life, because: It may be challenging to find places to smoke when away from home. Many workplaces, restaurants, hotels, and public places are tobacco-free. Smoking is expensive. This is due to the cost of tobacco and the long-term costs of treating health problems from smoking. Secondhand smoke may affect those around you. Secondhand smoke can cause lung cancer, breathing problems, and heart disease. Children of smokers have a higher risk for: Sudden infant death syndrome (SIDS). Ear infections. Lung infections. What  actions can I take to prevent health problems? Quit smoking  Do not start smoking. Quit if you already smoke. Do not replace cigarette smoking with vaping devices, such as e-cigarettes. Make a plan to quit smoking and commit to it. Look for programs to help you, and ask your health care provider for recommendations and ideas. Set a date and write down all the reasons you want to quit. Let your friends and family know you are quitting so they can help and support you. Consider finding friends who also want to quit. It can be easier to quit with someone else, so that you can support each other. Talk with your health care provider about using nicotine replacement medicines to help you quit. These include gum, lozenges, patches, sprays, or pills. If you try to quit but return to smoking, stay positive. It is common to slip up when you first quit, so take it one day at a time. Be prepared for cravings. When you feel the urge to smoke, chew gum or suck on hard candy. Lifestyle Stay busy. Take care of your body. Get plenty of exercise, eat a healthy diet, and drink plenty of water. Find ways to manage your stress, such as meditation, yoga, exercise, or time spent with friends and family. Ask your health care provider about having regular tests (screenings) to check for cancer. This may include blood tests, imaging tests, and other tests. Where to find support To get support to quit smoking, consider: Asking your health care provider for more information and resources. Joining a support group for people who want to quit smoking in your local community. There are many effective programs that may help you to quit. Calling the smokefree.gov counselor   helpline at 1-800-QUIT-NOW (1-800-784-8669). Where to find more information You may find more information about quitting smoking from: Centers for Disease Control and Prevention: cdc.gov/tobacco Smokefree.gov: smokefree.gov American Lung Association:  freedomfromsmoking.org Contact a health care provider if: You have problems breathing. Your lips, nose, or fingers turn blue. You have chest pain. You are coughing up blood. You feel like you will faint. You have other health changes that cause you to worry. Summary Smoking tobacco can negatively affect your health, the health of those around you, your finances, and your social life. Do not start smoking. Quit if you already smoke. If you need help quitting, ask your health care provider. Consider joining a support group for people in your local community who want to quit smoking. There are many effective programs that may help you to quit. This information is not intended to replace advice given to you by your health care provider. Make sure you discuss any questions you have with your health care provider. Document Revised: 12/25/2020 Document Reviewed: 12/25/2020 Elsevier Patient Education  2023 Elsevier Inc.  Health Maintenance, Female Adopting a healthy lifestyle and getting preventive care are important in promoting health and wellness. Ask your health care provider about: The right schedule for you to have regular tests and exams. Things you can do on your own to prevent diseases and keep yourself healthy. What should I know about diet, weight, and exercise? Eat a healthy diet  Eat a diet that includes plenty of vegetables, fruits, low-fat dairy products, and lean protein. Do not eat a lot of foods that are high in solid fats, added sugars, or sodium. Maintain a healthy weight Body mass index (BMI) is used to identify weight problems. It estimates body fat based on height and weight. Your health care provider can help determine your BMI and help you achieve or maintain a healthy weight. Get regular exercise Get regular exercise. This is one of the most important things you can do for your health. Most adults should: Exercise for at least 150 minutes each week. The exercise should  increase your heart rate and make you sweat (moderate-intensity exercise). Do strengthening exercises at least twice a week. This is in addition to the moderate-intensity exercise. Spend less time sitting. Even light physical activity can be beneficial. Watch cholesterol and blood lipids Have your blood tested for lipids and cholesterol at 40 years of age, then have this test every 5 years. Have your cholesterol levels checked more often if: Your lipid or cholesterol levels are high. You are older than 40 years of age. You are at high risk for heart disease. What should I know about cancer screening? Depending on your health history and family history, you may need to have cancer screening at various ages. This may include screening for: Breast cancer. Cervical cancer. Colorectal cancer. Skin cancer. Lung cancer. What should I know about heart disease, diabetes, and high blood pressure? Blood pressure and heart disease High blood pressure causes heart disease and increases the risk of stroke. This is more likely to develop in people who have high blood pressure readings or are overweight. Have your blood pressure checked: Every 3-5 years if you are 18-39 years of age. Every year if you are 40 years old or older. Diabetes Have regular diabetes screenings. This checks your fasting blood sugar level. Have the screening done: Once every three years after age 40 if you are at a normal weight and have a low risk for diabetes. More often and at a   younger age if you are overweight or have a high risk for diabetes. What should I know about preventing infection? Hepatitis B If you have a higher risk for hepatitis B, you should be screened for this virus. Talk with your health care provider to find out if you are at risk for hepatitis B infection. Hepatitis C Testing is recommended for: Everyone born from 1945 through 1965. Anyone with known risk factors for hepatitis C. Sexually transmitted  infections (STIs) Get screened for STIs, including gonorrhea and chlamydia, if: You are sexually active and are younger than 40 years of age. You are older than 40 years of age and your health care provider tells you that you are at risk for this type of infection. Your sexual activity has changed since you were last screened, and you are at increased risk for chlamydia or gonorrhea. Ask your health care provider if you are at risk. Ask your health care provider about whether you are at high risk for HIV. Your health care provider may recommend a prescription medicine to help prevent HIV infection. If you choose to take medicine to prevent HIV, you should first get tested for HIV. You should then be tested every 3 months for as long as you are taking the medicine. Pregnancy If you are about to stop having your period (premenopausal) and you may become pregnant, seek counseling before you get pregnant. Take 400 to 800 micrograms (mcg) of folic acid every day if you become pregnant. Ask for birth control (contraception) if you want to prevent pregnancy. Osteoporosis and menopause Osteoporosis is a disease in which the bones lose minerals and strength with aging. This can result in bone fractures. If you are 65 years old or older, or if you are at risk for osteoporosis and fractures, ask your health care provider if you should: Be screened for bone loss. Take a calcium or vitamin D supplement to lower your risk of fractures. Be given hormone replacement therapy (HRT) to treat symptoms of menopause. Follow these instructions at home: Alcohol use Do not drink alcohol if: Your health care provider tells you not to drink. You are pregnant, may be pregnant, or are planning to become pregnant. If you drink alcohol: Limit how much you have to: 0-1 drink a day. Know how much alcohol is in your drink. In the [place] one drink equals one 12 oz bottle of beer (355 mL), one 5 oz glass of wine (148 mL), or one  1 oz glass of hard liquor (44 mL). Lifestyle Do not use any products that contain nicotine or tobacco. These products include cigarettes, chewing tobacco, and vaping devices, such as e-cigarettes. If you need help quitting, ask your health care provider. Do not use street drugs. Do not share needles. Ask your health care provider for help if you need support or information about quitting drugs. General instructions Schedule regular health, dental, and eye exams. Stay current with your vaccines. Tell your health care provider if: You often feel depressed. You have ever been abused or do not feel safe at home. Summary Adopting a healthy lifestyle and getting preventive care are important in promoting health and wellness. Follow your health care provider's instructions about healthy diet, exercising, and getting tested or screened for diseases. Follow your health care provider's instructions on monitoring your cholesterol and blood pressure. This information is not intended to replace advice given to you by your health care provider. Make sure you discuss any questions you have with your health care   provider. Document Revised: 05/21/2020 Document Reviewed: 05/21/2020 Elsevier Patient Education  2023 Elsevier Inc.  

## 2021-12-20 LAB — CMP14+EGFR
ALT: 5 IU/L (ref 0–32)
AST: 14 IU/L (ref 0–40)
Albumin/Globulin Ratio: 1.8 (ref 1.2–2.2)
Albumin: 4.7 g/dL (ref 3.9–4.9)
Alkaline Phosphatase: 90 IU/L (ref 44–121)
BUN/Creatinine Ratio: 6 — ABNORMAL LOW (ref 9–23)
BUN: 5 mg/dL — ABNORMAL LOW (ref 6–24)
Bilirubin Total: 0.4 mg/dL (ref 0.0–1.2)
CO2: 23 mmol/L (ref 20–29)
Calcium: 9.5 mg/dL (ref 8.7–10.2)
Chloride: 101 mmol/L (ref 96–106)
Creatinine, Ser: 0.88 mg/dL (ref 0.57–1.00)
Globulin, Total: 2.6 g/dL (ref 1.5–4.5)
Glucose: 92 mg/dL (ref 70–99)
Potassium: 4 mmol/L (ref 3.5–5.2)
Sodium: 140 mmol/L (ref 134–144)
Total Protein: 7.3 g/dL (ref 6.0–8.5)
eGFR: 85 mL/min/{1.73_m2} (ref >59)

## 2021-12-20 LAB — CBC WITH DIFFERENTIAL/PLATELET
Basophils Absolute: 0 10*3/uL (ref 0.0–0.2)
Basos: 0 %
EOS (ABSOLUTE): 0.1 10*3/uL (ref 0.0–0.4)
Eos: 1 %
Hematocrit: 38.4 % (ref 34.0–46.6)
Hemoglobin: 13.1 g/dL (ref 11.1–15.9)
Immature Grans (Abs): 0 10*3/uL (ref 0.0–0.1)
Immature Granulocytes: 0 %
Lymphocytes Absolute: 2.6 10*3/uL (ref 0.7–3.1)
Lymphs: 33 %
MCH: 31.3 pg (ref 26.6–33.0)
MCHC: 34.1 g/dL (ref 31.5–35.7)
MCV: 92 fL (ref 79–97)
Monocytes Absolute: 0.5 10*3/uL (ref 0.1–0.9)
Monocytes: 6 %
Neutrophils Absolute: 4.7 10*3/uL (ref 1.4–7.0)
Neutrophils: 60 %
Platelets: 248 10*3/uL (ref 150–450)
RBC: 4.18 x10E6/uL (ref 3.77–5.28)
RDW: 15.6 % — ABNORMAL HIGH (ref 11.7–15.4)
WBC: 7.8 10*3/uL (ref 3.4–10.8)

## 2021-12-20 LAB — LIPID PANEL
Chol/HDL Ratio: 2.9 ratio (ref 0.0–4.4)
Cholesterol, Total: 113 mg/dL (ref 100–199)
HDL: 39 mg/dL — ABNORMAL LOW (ref >39)
LDL Chol Calc (NIH): 55 mg/dL (ref 0–99)
Triglycerides: 98 mg/dL (ref 0–149)
VLDL Cholesterol Cal: 19 mg/dL (ref 5–40)

## 2021-12-20 LAB — TSH: TSH: 1.79 u[IU]/mL (ref 0.450–4.500)

## 2022-01-21 DIAGNOSIS — R109 Unspecified abdominal pain: Secondary | ICD-10-CM | POA: Diagnosis not present

## 2022-01-21 DIAGNOSIS — I700 Atherosclerosis of aorta: Secondary | ICD-10-CM | POA: Diagnosis not present

## 2022-01-21 DIAGNOSIS — K2900 Acute gastritis without bleeding: Secondary | ICD-10-CM | POA: Diagnosis not present

## 2022-01-21 DIAGNOSIS — R1012 Left upper quadrant pain: Secondary | ICD-10-CM | POA: Diagnosis not present

## 2022-01-21 DIAGNOSIS — N390 Urinary tract infection, site not specified: Secondary | ICD-10-CM | POA: Diagnosis not present

## 2022-01-28 DIAGNOSIS — F411 Generalized anxiety disorder: Secondary | ICD-10-CM | POA: Diagnosis not present

## 2022-01-28 DIAGNOSIS — F331 Major depressive disorder, recurrent, moderate: Secondary | ICD-10-CM | POA: Diagnosis not present

## 2022-02-28 DIAGNOSIS — F331 Major depressive disorder, recurrent, moderate: Secondary | ICD-10-CM | POA: Diagnosis not present

## 2022-02-28 DIAGNOSIS — F411 Generalized anxiety disorder: Secondary | ICD-10-CM | POA: Diagnosis not present

## 2022-03-12 ENCOUNTER — Other Ambulatory Visit: Payer: Self-pay | Admitting: Cardiology

## 2022-03-14 DIAGNOSIS — G40219 Localization-related (focal) (partial) symptomatic epilepsy and epileptic syndromes with complex partial seizures, intractable, without status epilepticus: Secondary | ICD-10-CM | POA: Diagnosis not present

## 2022-03-28 DIAGNOSIS — R079 Chest pain, unspecified: Secondary | ICD-10-CM | POA: Diagnosis not present

## 2022-03-28 DIAGNOSIS — I10 Essential (primary) hypertension: Secondary | ICD-10-CM | POA: Diagnosis not present

## 2022-03-28 DIAGNOSIS — R519 Headache, unspecified: Secondary | ICD-10-CM | POA: Diagnosis not present

## 2022-03-28 DIAGNOSIS — Z720 Tobacco use: Secondary | ICD-10-CM | POA: Diagnosis not present

## 2022-04-28 DIAGNOSIS — R079 Chest pain, unspecified: Secondary | ICD-10-CM | POA: Diagnosis not present

## 2022-04-28 DIAGNOSIS — R0902 Hypoxemia: Secondary | ICD-10-CM | POA: Diagnosis not present

## 2022-04-28 DIAGNOSIS — R569 Unspecified convulsions: Secondary | ICD-10-CM | POA: Diagnosis not present

## 2022-04-28 DIAGNOSIS — I10 Essential (primary) hypertension: Secondary | ICD-10-CM | POA: Diagnosis not present

## 2022-04-28 DIAGNOSIS — Z720 Tobacco use: Secondary | ICD-10-CM | POA: Diagnosis not present

## 2022-04-28 DIAGNOSIS — I959 Hypotension, unspecified: Secondary | ICD-10-CM | POA: Diagnosis not present

## 2022-04-28 DIAGNOSIS — R519 Headache, unspecified: Secondary | ICD-10-CM | POA: Diagnosis not present

## 2022-04-29 ENCOUNTER — Telehealth: Payer: Self-pay | Admitting: *Deleted

## 2022-04-29 NOTE — Transitions of Care (Post Inpatient/ED Visit) (Signed)
   04/29/2022  Name: Melissa Barrett MRN: [id number] DOB: [birth year]  Today's TOC FU Call Status: Today's TOC FU Call Status:: Unsuccessul Call (1st Attempt) Unsuccessful Call (1st Attempt) Date: 04/29/22  Attempted to reach the patient regarding the most recent Inpatient/ED visit.  Follow Up Plan: Additional outreach attempts will be made to reach the patient to complete the Transitions of Care (Post Inpatient/ED visit) call.   Estanislado Emms RN, BSN [place]  Managed [facility] RN Care Coordinator [phone]

## 2022-04-30 DIAGNOSIS — F331 Major depressive disorder, recurrent, moderate: Secondary | ICD-10-CM | POA: Diagnosis not present

## 2022-04-30 DIAGNOSIS — F411 Generalized anxiety disorder: Secondary | ICD-10-CM | POA: Diagnosis not present

## 2022-05-02 ENCOUNTER — Telehealth: Payer: Self-pay | Admitting: *Deleted

## 2022-05-02 NOTE — Transitions of Care (Post Inpatient/ED Visit) (Signed)
   05/02/2022  Name: SAMADHI MAHURIN MRN: [id number] DOB: 1980-03-24  Today's TOC FU Call Status: Today's TOC FU Call Status:: Successful TOC FU Call Competed TOC FU Call Complete Date: 05/02/22  Transition Care Management Follow-up Telephone Call How have you been since you were released from the hospital?: Better Any questions or concerns?: No  Items Reviewed: Did you receive and understand the discharge instructions provided?: Yes Medications obtained and verified?: Yes (Medications Reviewed) Any new allergies since your discharge?: No Dietary orders reviewed?: NA Do you have support at home?: Yes People in Home: friend(s) Name of Support/Comfort Primary Source: did not provide name  Home Care and Equipment/Supplies: Were Home Health Services Ordered?: NA Any new equipment or medical supplies ordered?: NA  Functional Questionnaire: Do you need assistance with bathing/showering or dressing?: No Do you need assistance with meal preparation?: No Do you need assistance with eating?: No Do you have difficulty maintaining continence: No Do you need assistance with getting out of bed/getting out of a chair/moving?: No Do you have difficulty managing or taking your medications?: No  Follow up appointments reviewed: PCP Follow-up appointment confirmed?: NA Specialist Hospital Follow-up appointment confirmed?: Yes Date of Specialist follow-up appointment?: 06/20/22 Follow-Up Specialty Provider:: Neurology Do you need transportation to your follow-up appointment?: No Do you understand care options if your condition(s) worsen?: Yes-patient verbalized understanding  SDOH Interventions Today    Flowsheet Row Most Recent Value  SDOH Interventions   Transportation Interventions Intervention Not Indicated      The patient was given information about care management services as a benefit of their Medicaid health plan today.   Patient                                              did  not agree to enrollment in care management services and does not wish to consider at this time.   Estanislado Emms RN, BSN [place]  Managed [facility] RN Care Coordinator [phone]

## 2022-05-28 DIAGNOSIS — F331 Major depressive disorder, recurrent, moderate: Secondary | ICD-10-CM | POA: Diagnosis not present

## 2022-05-28 DIAGNOSIS — F411 Generalized anxiety disorder: Secondary | ICD-10-CM | POA: Diagnosis not present

## 2022-08-12 DIAGNOSIS — F331 Major depressive disorder, recurrent, moderate: Secondary | ICD-10-CM | POA: Diagnosis not present

## 2022-08-12 DIAGNOSIS — F411 Generalized anxiety disorder: Secondary | ICD-10-CM | POA: Diagnosis not present

## 2022-09-02 DIAGNOSIS — F411 Generalized anxiety disorder: Secondary | ICD-10-CM | POA: Diagnosis not present

## 2022-09-02 DIAGNOSIS — F331 Major depressive disorder, recurrent, moderate: Secondary | ICD-10-CM | POA: Diagnosis not present

## 2022-09-11 DIAGNOSIS — S161XXA Strain of muscle, fascia and tendon at neck level, initial encounter: Secondary | ICD-10-CM | POA: Diagnosis not present

## 2022-09-11 DIAGNOSIS — R519 Headache, unspecified: Secondary | ICD-10-CM | POA: Diagnosis not present

## 2022-09-11 DIAGNOSIS — M79631 Pain in right forearm: Secondary | ICD-10-CM | POA: Diagnosis not present

## 2022-09-14 DIAGNOSIS — H5213 Myopia, bilateral: Secondary | ICD-10-CM | POA: Diagnosis not present

## 2022-09-30 DIAGNOSIS — F411 Generalized anxiety disorder: Secondary | ICD-10-CM | POA: Diagnosis not present

## 2022-09-30 DIAGNOSIS — F331 Major depressive disorder, recurrent, moderate: Secondary | ICD-10-CM | POA: Diagnosis not present

## 2022-11-30 DIAGNOSIS — R072 Precordial pain: Secondary | ICD-10-CM | POA: Diagnosis not present

## 2022-11-30 DIAGNOSIS — R42 Dizziness and giddiness: Secondary | ICD-10-CM | POA: Diagnosis not present

## 2022-11-30 DIAGNOSIS — R569 Unspecified convulsions: Secondary | ICD-10-CM | POA: Diagnosis not present

## 2022-11-30 DIAGNOSIS — R55 Syncope and collapse: Secondary | ICD-10-CM | POA: Diagnosis not present

## 2022-12-03 ENCOUNTER — Encounter: Payer: Self-pay | Admitting: *Deleted

## 2022-12-04 ENCOUNTER — Ambulatory Visit: Payer: Medicaid Other | Admitting: Cardiology

## 2022-12-14 ENCOUNTER — Other Ambulatory Visit: Payer: Self-pay | Admitting: Cardiology

## 2022-12-22 ENCOUNTER — Other Ambulatory Visit (HOSPITAL_COMMUNITY)
Admission: RE | Admit: 2022-12-22 | Discharge: 2022-12-22 | Disposition: A | Discharge status: Home / Self Care | Payer: Medicaid Other | Source: Ambulatory Visit | Attending: Family Medicine | Admitting: Family Medicine

## 2022-12-22 ENCOUNTER — Ambulatory Visit (INDEPENDENT_AMBULATORY_CARE_PROVIDER_SITE_OTHER): Payer: Medicaid Other | Admitting: Family Medicine

## 2022-12-22 ENCOUNTER — Encounter: Payer: Self-pay | Admitting: Family Medicine

## 2022-12-22 VITALS — BP 138/79 | HR 67 | Temp 98.2°F | Ht 64.0 in | Wt 212.2 lb

## 2022-12-22 DIAGNOSIS — F331 Major depressive disorder, recurrent, moderate: Secondary | ICD-10-CM

## 2022-12-22 DIAGNOSIS — Z124 Encounter for screening for malignant neoplasm of cervix: Secondary | ICD-10-CM

## 2022-12-22 DIAGNOSIS — I10 Essential (primary) hypertension: Secondary | ICD-10-CM | POA: Diagnosis not present

## 2022-12-22 DIAGNOSIS — R6889 Other general symptoms and signs: Secondary | ICD-10-CM | POA: Diagnosis not present

## 2022-12-22 DIAGNOSIS — Z1159 Encounter for screening for other viral diseases: Secondary | ICD-10-CM

## 2022-12-22 DIAGNOSIS — Z0000 Encounter for general adult medical examination without abnormal findings: Secondary | ICD-10-CM

## 2022-12-22 DIAGNOSIS — R569 Unspecified convulsions: Secondary | ICD-10-CM | POA: Diagnosis not present

## 2022-12-22 DIAGNOSIS — F411 Generalized anxiety disorder: Secondary | ICD-10-CM | POA: Diagnosis not present

## 2022-12-22 DIAGNOSIS — Z0001 Encounter for general adult medical examination with abnormal findings: Secondary | ICD-10-CM | POA: Diagnosis not present

## 2022-12-22 DIAGNOSIS — K219 Gastro-esophageal reflux disease without esophagitis: Secondary | ICD-10-CM | POA: Diagnosis not present

## 2022-12-22 DIAGNOSIS — R7309 Other abnormal glucose: Secondary | ICD-10-CM | POA: Diagnosis not present

## 2022-12-22 LAB — BAYER DCA HB A1C WAIVED: HB A1C (BAYER DCA - WAIVED): 5.3 % (ref 4.8–5.6)

## 2022-12-22 LAB — CBC WITH DIFFERENTIAL/PLATELET

## 2022-12-22 NOTE — Progress Notes (Signed)
Complete physical exam  Patient: Melissa Barrett   DOB: [birth year]   42 y.o. Female  MRN: [id number]  Subjective:    Chief Complaint  Patient presents with   Annual Exam    Melissa Barrett is a 42 y.o. female who presents today for a complete physical exam. She reports consuming a general diet. No regular exercise, but stays active caring for her children. She has days that she feels well and days that she doesn't feel well. She reports sleeping fairly well. She does not have additional problems to discuss today.   Had seizure x2 last month. Complaint with medications. Established with neurology. Has follow up scheduled.  BP has been well controlled. Has been eating low salt diet. Denies chest pain, shortness of breath, palpitations, edema, focal weakness. Occasionally has dizziness due to medications. Overdue for follow up with cardiology.   Established with BH. Reports depression and anxiety symptoms are up and down. Currently well managed.   Taking prontonix for GERD with good control. Avoiding spicy foods. No symptoms currently.   She has not had a pap is many years.   Most recent fall risk assessment:    12/22/2022   10:19 AM  Fall Risk   Falls in the past year? 1  Number falls in past yr: 0  Injury with Fall? 0  Risk for fall due to : History of fall(s)  Follow up Falls evaluation completed     Most recent depression screenings:    12/22/2022   10:20 AM 12/19/2021   10:23 AM 06/14/2021   11:26 AM  Depression screen PHQ 2/9  Decreased Interest 0 1 1  Down, Depressed, Hopeless 1 1 2   PHQ - 2 Score 1 2 3   Altered sleeping 0 1 2  Tired, decreased energy 1 1 3   Change in appetite 0 0 1  Feeling bad or failure about yourself  0 0 1  Trouble concentrating 0 0 1  Moving slowly or fidgety/restless 0 0 0  Suicidal thoughts 0 0 0  PHQ-9 Score 2 4 11   Difficult doing work/chores Not difficult at all Somewhat difficult Somewhat difficult      12/22/2022   10:19 AM  12/19/2021   10:24 AM 06/14/2021   11:27 AM  GAD 7 : Generalized Anxiety Score  Nervous, Anxious, on Edge 1 1 1   Control/stop worrying 1 1 1   Worry too much - different things 1 1 1   Trouble relaxing 1 1 1   Restless 1 1 1   Easily annoyed or irritable 1 1 1   Afraid - awful might happen 1 1 1   Total GAD 7 Score 7 7 7   Anxiety Difficulty Not difficult at all Somewhat difficult Somewhat difficult      Vision:Within last year and Dental: No current dental problems and No regular dental care   Past Medical History:  Diagnosis Date   Acute systolic heart failure (HCC)    Alcohol abuse    Anxiety    Arthritis    Asthma    Depression    GERD (gastroesophageal reflux disease)    HTN (hypertension)    Hypertension    Hypokalemia    Pancreatitis    Peripartum cardiomyopathy    with acute systolic CHF   Seizures (HCC)    Tobacco abuse    Tobacco use disorder       Patient Care Team: Gabriel Earing, FNP as PCP - General (Family Medicine) Wyline Mood, Dorothe Pea, MD as PCP - Cardiology (  Cardiology) Tylene Fantasia., PA-C   Outpatient Medications Prior to Visit  Medication Sig   Albuterol Sulfate (PROAIR RESPICLICK) 108 (90 Base) MCG/ACT AEPB Inhale into the lungs.   divalproex (DEPAKOTE) 500 MG DR tablet Take 1 tablet (500 mg total) by mouth 2 (two) times daily.   ergocalciferol (VITAMIN D2) 1.25 MG (50000 UT) capsule Vitamin D2 1,250 mcg (50,000 unit) capsule  TAKE ONE CAPSULE BY MOUTH EVERY WEEK   Eslicarbazepine Acetate (APTIOM) 800 MG TABS Take 1.5 tablets by mouth every evening.   Midazolam 5 MG/0.1ML SOLN Use 1 spray in 1 nostril as needed for seizure lasting longer than 2 minutes or for seizure cluster.Call 911 if used.   TIADYLT ER 240 MG 24 hr capsule TAKE 1 CAPSULE BY MOUTH EVERY DAY   busPIRone (BUSPAR) 10 MG tablet Take 1 tablet by mouth 2 (two) times daily. (Patient not taking: Reported on 12/22/2022)   pantoprazole (PROTONIX) 40 MG tablet TAKE ONE TABLET BY MOUTH  DAILY (Patient not taking: Reported on 12/22/2022)   sertraline (ZOLOFT) 100 MG tablet Take 100 mg by mouth daily. (Patient not taking: Reported on 12/22/2022)   [DISCONTINUED] ibuprofen (ADVIL,MOTRIN) 800 MG tablet Take 800 mg by mouth every 6 (six) hours as needed for mild pain or moderate pain.   No facility-administered medications prior to visit.    ROS Negative unless specially indicated above in HPI.      Objective:     BP 138/79   Pulse 67   Temp 98.2 F (36.8 C) (Temporal)   Ht 5\' 4"  (1.626 m)   Wt 212 lb 4 oz (96.3 kg)   SpO2 98%   BMI 36.43 kg/m  Wt Readings from Last 3 Encounters:  12/22/22 212 lb 4 oz (96.3 kg)  12/19/21 208 lb 8 oz (94.6 kg)  06/14/21 224 lb 6 oz (101.8 kg)   BP Readings from Last 3 Encounters:  12/22/22 138/79  12/19/21 138/87  06/14/21 139/84     Physical Exam Vitals and nursing note reviewed. Exam conducted with a chaperone present.  Constitutional:      General: She is not in acute distress.    Appearance: Normal appearance. She is obese. She is not ill-appearing, toxic-appearing or diaphoretic.  HENT:     Head: Normocephalic.     Right Ear: Tympanic membrane, ear canal and external ear normal.     Left Ear: Tympanic membrane, ear canal and external ear normal.     Nose: Nose normal.     Mouth/Throat:     Mouth: Mucous membranes are moist.     Pharynx: Oropharynx is clear.  Eyes:     Extraocular Movements: Extraocular movements intact.     Conjunctiva/sclera: Conjunctivae normal.     Pupils: Pupils are equal, round, and reactive to light.  Cardiovascular:     Rate and Rhythm: Normal rate and regular rhythm.     Pulses: Normal pulses.     Heart sounds: Normal heart sounds. No murmur heard.    No friction rub. No gallop.  Pulmonary:     Effort: Pulmonary effort is normal.     Breath sounds: Normal breath sounds.  Abdominal:     General: Bowel sounds are normal. There is no distension.     Palpations: Abdomen is soft. There  is no mass.     Tenderness: There is no abdominal tenderness. There is no guarding.  Genitourinary:    General: Normal vulva.     Exam position: Lithotomy position.  Vagina: Normal.     Cervix: Normal.     Uterus: Normal.      Adnexa: Right adnexa normal and left adnexa normal.  Musculoskeletal:        General: No swelling or tenderness. Normal range of motion.     Cervical back: Normal range of motion and neck supple. No tenderness.     Right lower leg: No edema.     Left lower leg: No edema.  Lymphadenopathy:     Cervical: No cervical adenopathy.  Skin:    General: Skin is warm and dry.     Capillary Refill: Capillary refill takes less than 2 seconds.     Findings: No lesion or rash.  Neurological:     General: No focal deficit present.     Mental Status: She is alert and oriented to person, place, and time.     Cranial Nerves: No cranial nerve deficit.     Motor: No weakness.     Gait: Gait normal.  Psychiatric:        Mood and Affect: Mood normal.        Behavior: Behavior normal.        Thought Content: Thought content normal.        Judgment: Judgment normal.      No results found for any visits on 12/22/22.     Assessment & Plan:    Routine Health Maintenance and Physical Exam  Keiyanna was seen today for annual exam.  Diagnoses and all orders for this visit:  Routine general medical examination at a health care facility  Cervical cancer screening Pap today with HPV.  -     Cytology - PAP  Need for hepatitis C screening test -     Hepatitis C antibody  Morbid obesity (HCC) Diet, exercise, weight loss. Labs pending.  -     CBC with Differential/Platelet -     CMP14+EGFR -     Lipid panel -     TSH -     Bayer DCA Hb A1c Waived  GAD (generalized anxiety disorder) Moderate episode of recurrent major depressive disorder (HCC) Stable symptoms. Denies SI. Managed by [facility].   Seizures (HCC) Managed by neurology. Reports seizure x 2 last month, has  follow up scheduled.   Gastroesophageal reflux disease without esophagitis Well controlled on current regimen.   Primary hypertension Not quite at goal of <130/90. Discussed lifestyle management.    Immunization History  Administered Date(s) Administered   Influenza,inj,Quad PF,6+ Mos 11/04/2017, 12/19/2021   Pneumococcal Polysaccharide-23 11/04/2017   Td (Adult),5 Lf Tetanus Toxid, Preservative Free 06/13/2002   Tdap 12/19/2021    Health Maintenance  Topic Date Due   Hepatitis C Screening  Never done   Cervical Cancer Screening (HPV/Pap Cotest)  12/01/2011   INFLUENZA VACCINE  04/13/2023 (Originally 08/14/2022)   COVID-19 Vaccine (1) 01/07/2024 (Originally 09/25/1986)   DTaP/Tdap/Td (2 - Td or Tdap) 12/20/2031   HIV Screening  Completed   HPV VACCINES  Aged Out    Discussed health benefits of physical activity, and encouraged her to engage in regular exercise appropriate for her age and condition.  Problem List Items Addressed This Visit       Cardiovascular and Mediastinum   HTN (hypertension) (Chronic)   PSVT (paroxysmal supraventricular tachycardia) (HCC) (Chronic)     Digestive   Gastroesophageal reflux disease without esophagitis     Other   Seizures (HCC) (Chronic)   Moderate episode of recurrent major depressive disorder (HCC) (Chronic)  Morbid obesity (HCC) (Chronic)   Relevant Orders   CBC with Differential/Platelet   CMP14+EGFR   Lipid panel   TSH   Bayer DCA Hb A1c Waived (Completed)   GAD (generalized anxiety disorder)   Other Visit Diagnoses     Routine general medical examination at a health care facility    -  Primary   Cervical cancer screening       Relevant Orders   Cytology - PAP   Need for hepatitis C screening test       Relevant Orders   Hepatitis C antibody   Chronic congestive heart failure, unspecified heart failure type (HCC)          Return in 6 months (on 06/22/2023) for chronic follow up.   The patient indicates  understanding of these issues and agrees with the plan.   Gabriel Earing, FNP

## 2022-12-22 NOTE — Patient Instructions (Signed)

## 2022-12-23 LAB — CBC WITH DIFFERENTIAL/PLATELET
Basos: 1 %
EOS (ABSOLUTE): 0 10*3/uL (ref 0.0–0.2)
Eos: 1 %
Hematocrit: 38.8 % (ref 34.0–46.6)
Hemoglobin: 13.2 g/dL (ref 11.1–15.9)
Immature Granulocytes: 0 %
Immature Granulocytes: 0 10*3/uL (ref 0.0–0.1)
Lymphs: 41 %
MCH: 30.9 pg (ref 26.6–33.0)
MCHC: 34 g/dL (ref 31.5–35.7)
MCV: 91 fL (ref 79–97)
Monocytes Absolute: 0.1 10*3/uL (ref 0.0–0.4)
Monocytes Absolute: 0.5 10*3/uL (ref 0.1–0.9)
Monocytes: 7 %
Neutrophils Absolute: 2.8 10*3/uL (ref 0.7–3.1)
Neutrophils Absolute: 3.5 10*3/uL (ref 1.4–7.0)
Neutrophils: 50 %
Platelets: 243 10*3/uL (ref 150–450)
RBC: 4.27 x10E6/uL (ref 3.77–5.28)
RDW: 15.1 % (ref 11.7–15.4)
WBC: 6.9 10*3/uL (ref 3.4–10.8)

## 2022-12-23 LAB — CMP14+EGFR
ALT: 8 [IU]/L (ref 0–32)
AST: 20 [IU]/L (ref 0–40)
Albumin: 4.2 g/dL (ref 3.9–4.9)
Alkaline Phosphatase: 106 [IU]/L (ref 44–121)
BUN/Creatinine Ratio: 9 (ref 9–23)
BUN: 9 mg/dL (ref 6–24)
Bilirubin Total: 0.3 mg/dL (ref 0.0–1.2)
CO2: 23 mmol/L (ref 20–29)
Calcium: 9.4 mg/dL (ref 8.7–10.2)
Chloride: 103 mmol/L (ref 96–106)
Creatinine, Ser: 0.98 mg/dL (ref 0.57–1.00)
Globulin, Total: 3.1 g/dL (ref 1.5–4.5)
Glucose: 92 mg/dL (ref 70–99)
Potassium: 4.1 mmol/L (ref 3.5–5.2)
Sodium: 140 mmol/L (ref 134–144)
Total Protein: 7.3 g/dL (ref 6.0–8.5)
eGFR: 74 mL/min/{1.73_m2} (ref >59)

## 2022-12-23 LAB — LIPID PANEL
Chol/HDL Ratio: 3.2 {ratio} (ref 0.0–4.4)
Cholesterol, Total: 128 mg/dL (ref 100–199)
HDL: 40 mg/dL (ref >39)
LDL Chol Calc (NIH): 74 mg/dL (ref 0–99)
Triglycerides: 66 mg/dL (ref 0–149)
VLDL Cholesterol Cal: 14 mg/dL (ref 5–40)

## 2022-12-23 LAB — HEPATITIS C ANTIBODY

## 2022-12-23 LAB — TSH: TSH: 1.5 u[IU]/mL (ref 0.450–4.500)

## 2022-12-24 LAB — CYTOLOGY - PAP
Chlamydia: NEGATIVE
Comment: NEGATIVE
Comment: NEGATIVE
Comment: NORMAL
Diagnosis: UNDETERMINED — AB
High risk HPV: NEGATIVE
Neisseria Gonorrhea: NEGATIVE

## 2022-12-25 ENCOUNTER — Other Ambulatory Visit: Payer: Self-pay | Admitting: *Deleted

## 2022-12-25 ENCOUNTER — Other Ambulatory Visit: Payer: Self-pay | Admitting: Family Medicine

## 2022-12-25 DIAGNOSIS — R87610 Atypical squamous cells of undetermined significance on cytologic smear of cervix (ASC-US): Secondary | ICD-10-CM

## 2022-12-25 DIAGNOSIS — A599 Trichomoniasis, unspecified: Secondary | ICD-10-CM

## 2022-12-25 MED ORDER — METRONIDAZOLE 500 MG PO TABS: 500.0000 mg | ORAL_TABLET | Freq: Two times a day (BID) | ORAL | 0 refills | Status: AC

## 2023-01-15 ENCOUNTER — Encounter: Payer: Self-pay | Admitting: Family Medicine

## 2023-01-15 ENCOUNTER — Ambulatory Visit: Payer: Medicaid Other | Admitting: Family Medicine

## 2023-03-05 DIAGNOSIS — J069 Acute upper respiratory infection, unspecified: Secondary | ICD-10-CM | POA: Diagnosis not present

## 2023-03-05 DIAGNOSIS — R7989 Other specified abnormal findings of blood chemistry: Secondary | ICD-10-CM | POA: Diagnosis not present

## 2023-03-05 DIAGNOSIS — R079 Chest pain, unspecified: Secondary | ICD-10-CM | POA: Diagnosis not present

## 2023-03-05 DIAGNOSIS — R0789 Other chest pain: Secondary | ICD-10-CM | POA: Diagnosis not present

## 2023-03-05 DIAGNOSIS — R059 Cough, unspecified: Secondary | ICD-10-CM | POA: Diagnosis not present

## 2023-03-16 ENCOUNTER — Encounter: Payer: Self-pay | Admitting: *Deleted

## 2023-03-17 ENCOUNTER — Ambulatory Visit: Payer: Medicaid Other | Admitting: Cardiology

## 2023-04-28 DIAGNOSIS — M25562 Pain in left knee: Secondary | ICD-10-CM | POA: Diagnosis not present

## 2023-04-28 DIAGNOSIS — N611 Abscess of the breast and nipple: Secondary | ICD-10-CM | POA: Diagnosis not present

## 2023-04-28 DIAGNOSIS — N644 Mastodynia: Secondary | ICD-10-CM | POA: Diagnosis not present

## 2023-04-29 DIAGNOSIS — M25562 Pain in left knee: Secondary | ICD-10-CM | POA: Diagnosis not present

## 2023-05-11 ENCOUNTER — Other Ambulatory Visit: Payer: Self-pay | Admitting: Cardiology

## 2023-05-19 ENCOUNTER — Encounter: Payer: Self-pay | Admitting: Cardiology

## 2023-05-19 ENCOUNTER — Ambulatory Visit: Attending: Cardiology | Admitting: Cardiology

## 2023-05-19 VITALS — BP 152/85 | HR 76 | Ht 64.0 in | Wt 215.2 lb

## 2023-05-19 DIAGNOSIS — I471 Supraventricular tachycardia, unspecified: Secondary | ICD-10-CM | POA: Insufficient documentation

## 2023-05-19 DIAGNOSIS — I10 Essential (primary) hypertension: Secondary | ICD-10-CM | POA: Diagnosis not present

## 2023-05-19 DIAGNOSIS — R002 Palpitations: Secondary | ICD-10-CM | POA: Diagnosis not present

## 2023-05-19 NOTE — Progress Notes (Signed)
 Clinical Summary Ms. Melissa Barrett is a 43 y.o.female seen today for follow up of the following medical problems.       1. Palpitations - prior admission to [facility] with palpitations, found to be in SVT. This apparently occurred in setting of allergic reaction to shrimp.  - converted to NSR with IV dilt. - D-dimer was elevated at the time, CT PE was negative. Mildly low TSH but normal free T4.      - lopressor  caused dizziness, changed to dilt   12/2019 monitor: Rare PACs and PVCs, no significant arrhythmias  - mild palpitations - compliant with meds though has not take her diltiazem  yet today.    2. History of seizures - started on keppra  - followed by neuro       3. History of peripartum CM - mention of LVEF as low as 15-20% in 2010 - Echo [place] 06/2009 in the media section of epic shows LVEF normalized to 50-55% - history tubal ligation   4. Chest pain/DOE -12/2019 echo LVEF 55-60%, no WMAs - 12/2019 nuclear stress: no ischemia  - ER visit 02/2023 with productive cough and chest pain - troponin was checked and flat 40-42.Ddimer negative - symptoms resolved with resolution of URI symptoms   -no chest pains, no SOB/DOE     5. Syncope - no recurrent episodes.    6. GERD - has tried prilosec without benefit, rolaids - burning pain with laying down. - taking ibuprofen 3 days out of the week.    7. HTN - had not taken meds yet today         Has 42 year old twins, a boy and a girl. 2 older kids, 4 grandkids Past Medical History:  Diagnosis Date   Acute systolic heart failure (HCC)    Alcohol abuse    Anxiety    Arthritis    Asthma    Depression    GERD (gastroesophageal reflux disease)    HTN (hypertension)    Hypertension    Hypokalemia    Pancreatitis    Peripartum cardiomyopathy    with acute systolic CHF   Seizures (HCC)    Tobacco abuse    Tobacco use disorder      Allergies  Allergen Reactions   Fish Allergy Shortness Of Breath    Shrimp Extract Shortness Of Breath and Rash   Amoxicillin Hives and Itching   Penicillins Hives and Itching    Has patient had a PCN reaction causing immediate rash, facial/tongue/throat swelling, SOB or lightheadedness with hypotension: No Has patient had a PCN reaction causing severe rash involving mucus membranes or skin necrosis: No Has patient had a PCN reaction that required hospitalization: No Has patient had a PCN reaction occurring within the last 10 years: No If all of the above answers are "NO", then may proceed with Cephalosporin use.      Current Outpatient Medications  Medication Sig Dispense Refill   Acetaminophen -Pamabrom 500-25 MG TABS Take 2 tablets by mouth daily as needed.     busPIRone (BUSPAR) 10 MG tablet Take 1 tablet by mouth 2 (two) times daily.     diltiazem  (TIAZAC ) 240 MG 24 hr capsule TAKE 1 CAPSULE BY MOUTH EVERY DAY 90 capsule 1   divalproex  (DEPAKOTE ) 500 MG DR tablet Take 1 tablet (500 mg total) by mouth 2 (two) times daily. 60 tablet 3   ergocalciferol (VITAMIN D2) 1.25 MG (50000 UT) capsule Vitamin D2 1,250 mcg (50,000 unit) capsule  TAKE  ONE CAPSULE BY MOUTH EVERY WEEK     Eslicarbazepine Acetate  (APTIOM ) 800 MG TABS Take 1.5 tablets by mouth every evening.     Midazolam 5 MG/0.1ML SOLN Use 1 spray in 1 nostril as needed for seizure lasting longer than 2 minutes or for seizure cluster.Call 911 if used.     Albuterol  Sulfate (PROAIR  RESPICLICK) 108 (90 Base) MCG/ACT AEPB Inhale into the lungs. (Patient not taking: Reported on 05/19/2023)     pantoprazole  (PROTONIX ) 40 MG tablet TAKE ONE TABLET BY MOUTH DAILY (Patient not taking: Reported on 05/19/2023) 30 tablet 6   sertraline (ZOLOFT) 100 MG tablet Take 100 mg by mouth daily. (Patient not taking: Reported on 05/19/2023)     No current facility-administered medications for this visit.     Past Surgical History:  Procedure Laterality Date   CESAREAN SECTION     DILATION AND CURETTAGE OF UTERUS     TUBAL  LIGATION       Allergies  Allergen Reactions   Fish Allergy Shortness Of Breath   Shrimp Extract Shortness Of Breath and Rash   Amoxicillin Hives and Itching   Penicillins Hives and Itching    Has patient had a PCN reaction causing immediate rash, facial/tongue/throat swelling, SOB or lightheadedness with hypotension: No Has patient had a PCN reaction causing severe rash involving mucus membranes or skin necrosis: No Has patient had a PCN reaction that required hospitalization: No Has patient had a PCN reaction occurring within the last 10 years: No If all of the above answers are "NO", then may proceed with Cephalosporin use.       Family History  Problem Relation Age of Onset   Arthritis Mother    Alcohol abuse Mother    Hyperlipidemia Father    Heart disease Father    Alcohol abuse Father    Hypertension Father    Diabetes Maternal Grandmother    Cancer Maternal Grandmother        breast cancer   Cancer Paternal Grandmother        breast cancer   Breast cancer Maternal Aunt      Social History Melissa Barrett reports that she has been smoking cigarettes. She started smoking about 24 years ago. She has a 24.6 pack-year smoking history. She has never used smokeless tobacco. Melissa Barrett reports that she does not currently use alcohol.    Physical Examination Today's Vitals   05/19/23 1352 05/19/23 1412  BP: (!) 164/92 (!) 152/85  Pulse: 76   SpO2: 99%   Weight: 215 lb 3.2 oz (97.6 kg)   Height: 5\' 4"  (1.626 m)    Body mass index is 36.94 kg/m.  Gen: resting comfortably, no acute distress HEENT: no scleral icterus, pupils equal round and reactive, no palptable cervical adenopathy,  CV: RRR, no m/rg, no jvd Resp: Clear to auscultation bilaterally GI: abdomen is soft, non-tender, non-distended, normal bowel sounds, no hepatosplenomegaly MSK: extremities are warm, no edema.  Skin: warm, no rash Neuro:  no focal deficits Psych: appropriate affect   Diagnostic  Studies 12/2019 echo 1. Left ventricular ejection fraction, by estimation, is 55 to 60%. The  left ventricle has normal function. The left ventricle has no regional  wall motion abnormalities. Left ventricular diastolic parameters were  normal.   2. Right ventricular systolic function is normal. The right ventricular  size is normal. There is normal pulmonary artery systolic pressure. The  estimated right ventricular systolic pressure is 18.4 mmHg.   3. Left atrial size was  mildly dilated.   4. The mitral valve is grossly normal. Trivial mitral valve  regurgitation.   5. The aortic valve is tricuspid. Aortic valve regurgitation is not  visualized.   6. The inferior vena cava is normal in size with greater than 50%  respiratory variability, suggesting right atrial pressure of 3 mmHg.         Assessment and Plan   1. Palpitations/PSVT - prior monitor with rare ectopy - symptoms well controlled on diltiazem , continue current therapy   2. HTN - elevated but has not taken her diltiazem  yet, from last visit was well controlled - continue current meds  F/u 1 year  Laurann Pollock, M.D.

## 2023-05-19 NOTE — Patient Instructions (Signed)

## 2023-06-22 ENCOUNTER — Encounter: Payer: Self-pay | Admitting: Family Medicine

## 2023-06-22 ENCOUNTER — Ambulatory Visit: Payer: Medicaid Other | Admitting: Family Medicine

## 2023-10-05 DIAGNOSIS — M25562 Pain in left knee: Secondary | ICD-10-CM | POA: Diagnosis not present

## 2023-10-05 DIAGNOSIS — M1712 Unilateral primary osteoarthritis, left knee: Secondary | ICD-10-CM | POA: Diagnosis not present

## 2023-10-05 DIAGNOSIS — M25462 Effusion, left knee: Secondary | ICD-10-CM | POA: Diagnosis not present

## 2023-11-20 ENCOUNTER — Other Ambulatory Visit: Payer: Self-pay | Admitting: Cardiology

## 2023-11-23 ENCOUNTER — Telehealth: Payer: Self-pay | Admitting: Cardiology

## 2023-11-23 MED ORDER — DILTIAZEM HCL ER BEADS 240 MG PO CP24: 240.0000 mg | ORAL_CAPSULE | Freq: Every day | ORAL | 1 refills | Status: AC

## 2023-11-23 NOTE — Telephone Encounter (Signed)
*  STAT* If patient is at the pharmacy, call can be transferred to refill team.   1. Which medications need to be refilled? (please list name of each medication and dose if known)   diltiazem  (TIAZAC ) 240 MG 24 hr capsule   2. Would you like to learn more about the convenience, safety, & potential cost savings by using the [facility]?   3. Are you open to using the [facility] (Type [facility]. ).  4. Which pharmacy/location (including street and city if local pharmacy) is medication to be sent to?  [id number] - [place], [place] - [address]   5. Do they need a 30 day or 90 day supply?     Patient stated she has been out of this medication for 2 days.
